# Patient Record
Sex: Male | Born: 1960 | Race: White | Hispanic: No | Marital: Married
Health system: Southern US, Community
[De-identification: ages and names within clinical notes are randomized; demographics above are authoritative.]

---

## 1999-07-22 ENCOUNTER — Encounter: Payer: Self-pay | Admitting: Gastroenterology

## 1999-07-22 ENCOUNTER — Encounter: Admission: RE | Admit: 1999-07-22 | Discharge: 1999-07-22 | Payer: Self-pay | Admitting: Gastroenterology

## 2000-06-15 ENCOUNTER — Encounter: Payer: Self-pay | Admitting: Orthopedic Surgery

## 2000-06-15 ENCOUNTER — Encounter: Admission: RE | Admit: 2000-06-15 | Discharge: 2000-06-15 | Payer: Self-pay | Admitting: Orthopedic Surgery

## 2000-08-09 ENCOUNTER — Encounter: Payer: Self-pay | Admitting: Orthopedic Surgery

## 2000-08-09 ENCOUNTER — Encounter: Admission: RE | Admit: 2000-08-09 | Discharge: 2000-08-09 | Payer: Self-pay | Admitting: Orthopedic Surgery

## 2008-01-30 ENCOUNTER — Ambulatory Visit (HOSPITAL_COMMUNITY): Admission: RE | Admit: 2008-01-30 | Discharge: 2008-01-31 | Payer: Self-pay | Admitting: Orthopedic Surgery

## 2010-10-18 NOTE — Op Note (Signed)
NAMEJOEANTHONY, SEELING                ACCOUNT NO.:  [id number]   MEDICAL RECORD NO.:  [id number]          PATIENT TYPE:  AMB   LOCATION:  SDS                          FACILITY:  [facility]   PHYSICIAN:  Alvy Beal, MD    DATE OF BIRTH:  1960/06/26   DATE OF PROCEDURE:  01/30/2008  DATE OF DISCHARGE:                               OPERATIVE REPORT   PREOPERATIVE DIAGNOSIS:  Degenerative cervical disk disease, C4-C5 and  C5-C6 with right arm radicular pain.   POSTOPERATIVE DIAGNOSIS:  Degenerative cervical disk disease, C4-C5 and  C5-C6 with right arm radicular pain.   OPERATIVE PROCEDURE:  Anterior cervical diskectomy and fusion, C4-C5 and  C5-C6.   COMPLICATIONS:  None.   ESTIMATED BLOOD LOSS:  45 mL.   INSTRUMENTATION USED:  Synthes Vector anterior cervical plate 34 mm in  length with 14 mL variable screws affixing to the bodies of C4-C5, 16-mm  screws into the body of C6 with 7-mm lordotic spacer.   FIRST ASSISTANT:  Crissie Reese, PA   OPERATIVE NOTE:  This is a very pleasant 50 year old gentleman who has  had significant neck and right arm pain.  Preoperative evaluation  confirmed the diagnosis of degenerative disk disease with right C5-C6  nerve irritation.  After discussing treatment options and the failure of  conservative management, the patient elected to proceed with surgery.  All appropriate risks, benefits, and alternatives were discussed and  consent was obtained.   OPERATIVE NOTE:  The patient was brought to the operating room and  placed supine on the operating table.  After successful induction of  general anesthesia and endotracheal intubation, TEDs, SCDs were applied  and the arms were secured down with tape and a rolled towel was placed  underneath the neck.  The neck was then prepped and draped in the  standard fashion.   A left standard Clementeen Graham approach was taken through a vertical  incision made just centered about the thyroid cartilage.   Sharp  dissection was carried out down to and through the platysma.  I then  sharply dissected through the deep cervical fascia mobilizing the  omohyoid.  Once I released the omohyoid from its length, it was mobile  enough that I did not need to sacrifice it.  I then swept the trachea  and esophagus medially and protected it with a appendiceal retractor and  then palpated and protected the carotid sheath laterally with a finger.  At this point, I could visualize the anterior cervical spine.  The  prevertebral fascia was mobilized using Kitner dissectors and I placed a  needle into the C4-C5 interbody space.  Once I confirmed that I was at  the appropriate level with lateral fluoroscopy, I then mobilized the  longus coli muscles out to the level of the uncovertebral joints using  bipolar electrocautery.  I then placed the self-retaining retractors,  the Caspar retractor, pins, and blades in, deflated the endotracheal  cuff, expanded them, and then reinflated the endotracheal cuff to reset  the pressure.  Distraction pins were placed into the bodies of C4, C5,  C6,  and then I distracted the C4-C5 disk space.   I incised the annulus with a 15 blade scalpel and using a combination of  pituitary rongeurs, curettes, and Kerrison rongeurs, I resected the  entire disk material.  I then developed a plane underneath the posterior  longitudinal ligament and resected the posterior longitudinal with a 1-  mm micro Kerrison rongeur.  At this point, I noted that there was a  fragment of disk material on the right-hand side and a significant  osteophyte which I was able to resect with the Kerrison rongeur.  With  the diskectomy completed, I rasped the endplates, irrigated copiously  with normal saline, and assured hemostasis.  I then measured the  interbody space, obtained a 7-mm precut lordotic M-TLIF fibular spacer  packed with Actifuse and malleted it to the appropriate depth.  Once I  was at the  appropriate depth, I removed the distraction device and reset  the Caspar retractors to expose now the C5-C6 interbody space.  Using  the same technique I used at C4-C5, I resected the disk material and  again took down the posterior longitudinal ligament.  Once again, there  was thickened hard disk osteophyte at the right-hand side which I was  able to resect.  At this point with the diskectomy completed, I measured  the interbody space, took another 7-mm lordotic precut graft, packed  with Actifuse and malleted to the appropriate depth.  I then confirmed  the satisfactory position of both grafts on lateral fluoroscopy, removed  the Caspar retracting blades, pins, and then took down many of superior  osteophytes that were still present.  I then took a contoured 34-mm  anterior cervical plate, gently contoured it, and secured it down with a  temporary set pin.  I confirmed satisfactory position in the lateral  plane and then secured it to the body of C4 with 14-mm variable angled  screws to the body of C6 with 60-mm variable screws and 40-mm variable  screws at C5.  All screws were properly locked and the D ring capture  mechanism was properly engaged.  I then rechecked to ensure that the  esophagus was not entrapped underneath the plate.  I then irrigated  copiously with normal saline, obtained hemostasis using bipolar  electrocautery, and then returned the trachea and esophagus to midline.  I then closed the platysma with interrupted 2-0 Vicryl sutures and 3-0  Monocryl for the skin.  Steri-Strips and Aspen collar were applied.  The  patient was extubated and transferred to PACU without incident.  At the  end of the case, all needle and sponge counts were correct.      Alvy Beal, MD  Electronically Signed     DDB/MEDQ  D:  01/30/2008  T:  01/31/2008  Job:  [id number]

## 2014-01-08 ENCOUNTER — Other Ambulatory Visit: Payer: Self-pay | Admitting: Family Medicine

## 2014-01-08 ENCOUNTER — Ambulatory Visit
Admission: RE | Admit: 2014-01-08 | Discharge: 2014-01-08 | Disposition: A | Discharge status: Home / Self Care | Payer: 59 | Source: Ambulatory Visit | Attending: Family Medicine | Admitting: Family Medicine

## 2014-01-08 DIAGNOSIS — M5410 Radiculopathy, site unspecified: Secondary | ICD-10-CM

## 2015-03-24 ENCOUNTER — Other Ambulatory Visit: Payer: Self-pay | Admitting: Surgery

## 2015-03-24 DIAGNOSIS — E042 Nontoxic multinodular goiter: Secondary | ICD-10-CM

## 2015-03-26 ENCOUNTER — Ambulatory Visit
Admission: RE | Admit: 2015-03-26 | Discharge: 2015-03-26 | Disposition: A | Discharge status: Home / Self Care | Payer: 59 | Source: Ambulatory Visit | Attending: Surgery | Admitting: Surgery

## 2015-03-26 DIAGNOSIS — E042 Nontoxic multinodular goiter: Secondary | ICD-10-CM

## 2015-04-08 ENCOUNTER — Other Ambulatory Visit: Payer: Self-pay | Admitting: Surgery

## 2015-04-08 DIAGNOSIS — E041 Nontoxic single thyroid nodule: Secondary | ICD-10-CM

## 2015-04-13 ENCOUNTER — Other Ambulatory Visit (HOSPITAL_COMMUNITY)
Admission: RE | Admit: 2015-04-13 | Discharge: 2015-04-13 | Disposition: A | Discharge status: Home / Self Care | Payer: 59 | Source: Ambulatory Visit | Attending: Radiology | Admitting: Radiology

## 2015-04-13 ENCOUNTER — Ambulatory Visit
Admission: RE | Admit: 2015-04-13 | Discharge: 2015-04-13 | Disposition: A | Discharge status: Home / Self Care | Payer: 59 | Source: Ambulatory Visit | Attending: Surgery | Admitting: Surgery

## 2015-04-13 DIAGNOSIS — E041 Nontoxic single thyroid nodule: Secondary | ICD-10-CM

## 2015-08-27 ENCOUNTER — Other Ambulatory Visit: Payer: Self-pay | Admitting: Surgery

## 2015-08-27 DIAGNOSIS — R9389 Abnormal findings on diagnostic imaging of other specified body structures: Secondary | ICD-10-CM

## 2015-09-01 ENCOUNTER — Ambulatory Visit
Admission: RE | Admit: 2015-09-01 | Discharge: 2015-09-01 | Disposition: A | Discharge status: Home / Self Care | Payer: 59 | Source: Ambulatory Visit | Attending: Surgery | Admitting: Surgery

## 2015-09-01 DIAGNOSIS — R9389 Abnormal findings on diagnostic imaging of other specified body structures: Secondary | ICD-10-CM

## 2016-09-13 ENCOUNTER — Other Ambulatory Visit: Payer: Self-pay | Admitting: Surgery

## 2016-09-13 DIAGNOSIS — E041 Nontoxic single thyroid nodule: Secondary | ICD-10-CM

## 2016-09-20 ENCOUNTER — Ambulatory Visit
Admission: RE | Admit: 2016-09-20 | Discharge: 2016-09-20 | Disposition: A | Discharge status: Home / Self Care | Payer: 59 | Source: Ambulatory Visit | Attending: Surgery | Admitting: Surgery

## 2016-09-20 DIAGNOSIS — E041 Nontoxic single thyroid nodule: Secondary | ICD-10-CM

## 2017-08-10 IMAGING — US US THYROID BIOPSY
1 series · 13 of 17 positions shown · non-contrast
Comparison: none

CLINICAL DATA: Patient with history of bilateral thyroid nodules
with a dominant solid/cystic left lower pole nodule measuring up to
2.5 cm. Request now made for needle aspirate biopsy of this dominant
left thyroid nodule.

[Series 1: us thyroid biopsy · 0.09mm/px · 17 acquisitions, 13 frames shown]
[im 1/17]
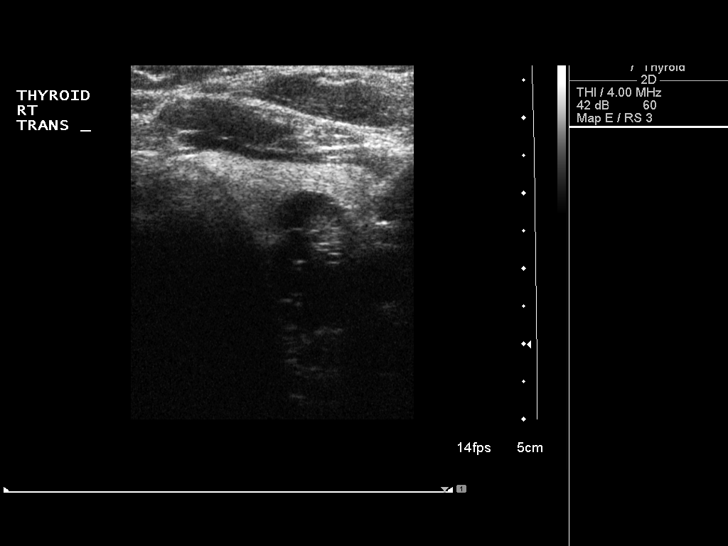
[im 2/17]
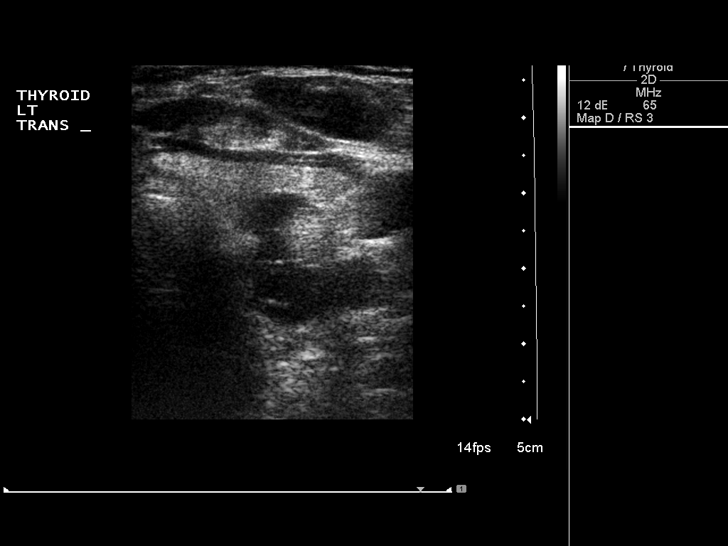
[im 4/17]
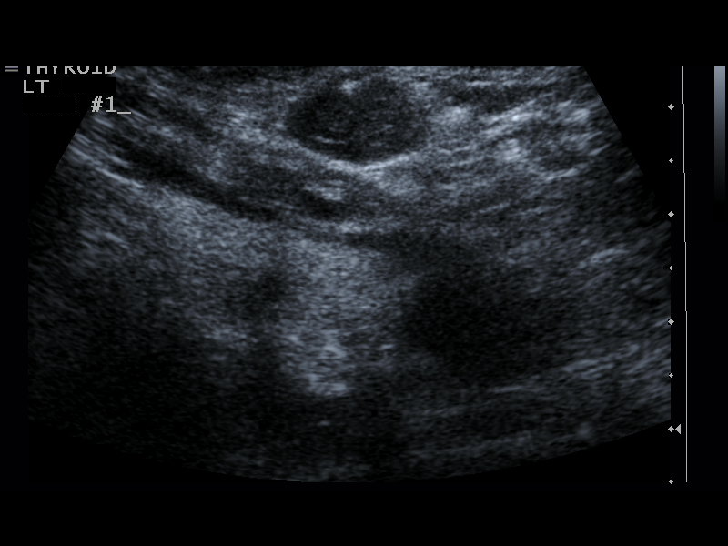
[im 5/17]
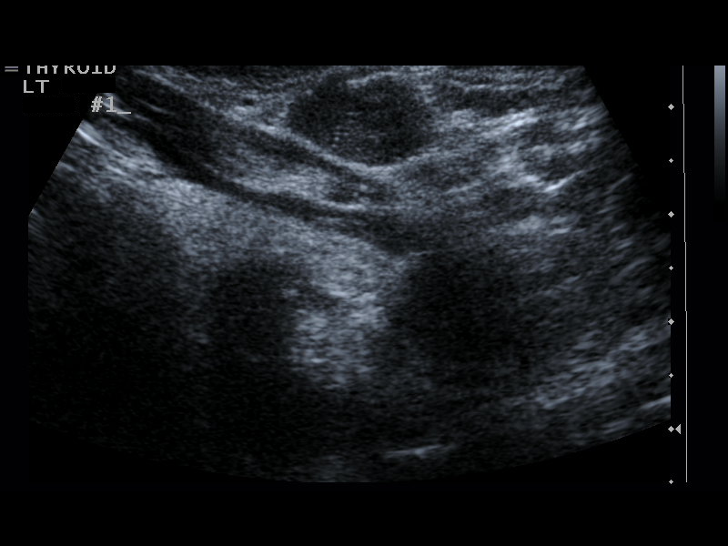
[im 6/17]
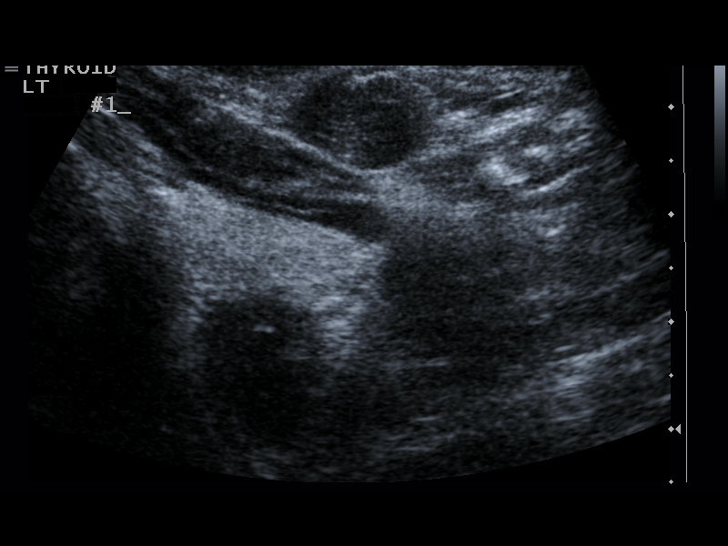
[im 8/17]
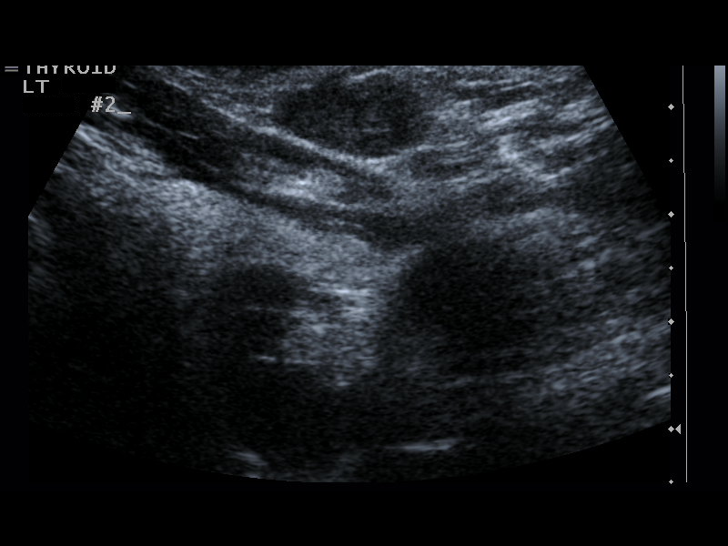
[im 9/17]
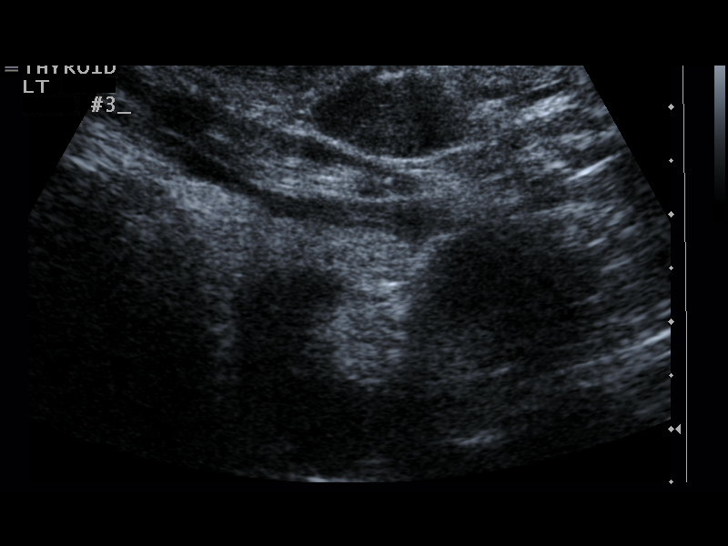
[im 10/17]
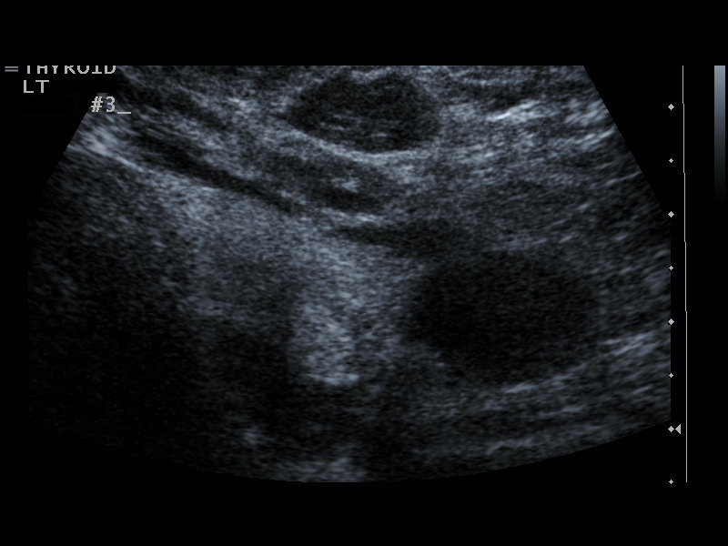
[im 12/17]
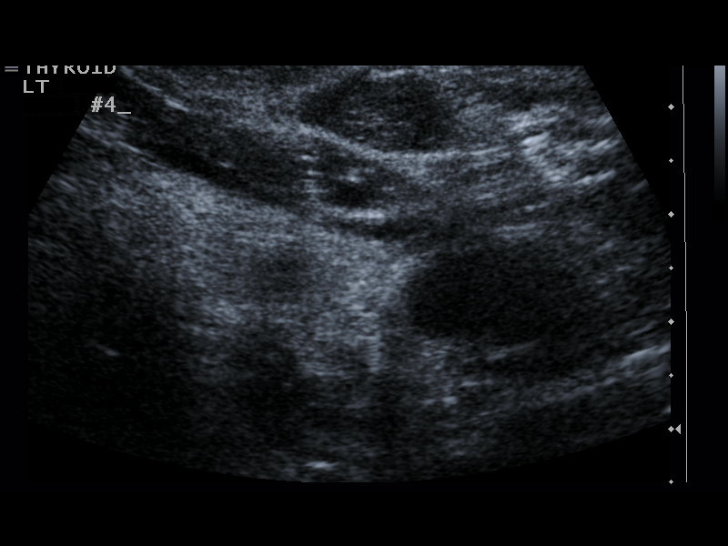
[im 13/17]
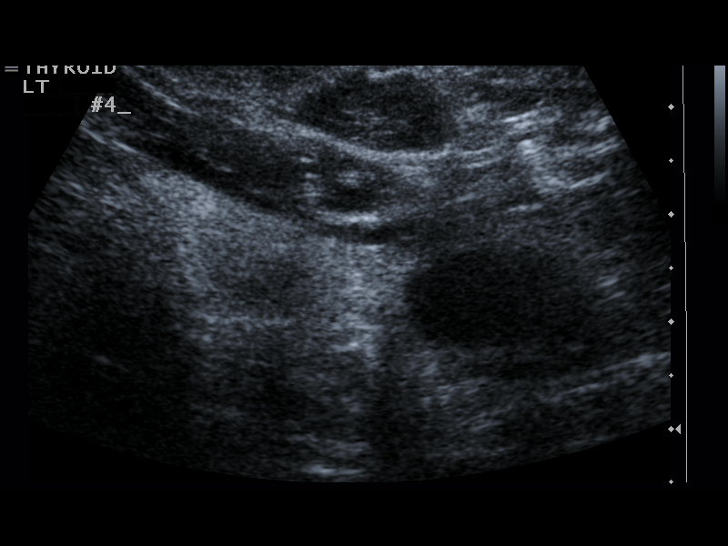
[im 14/17]
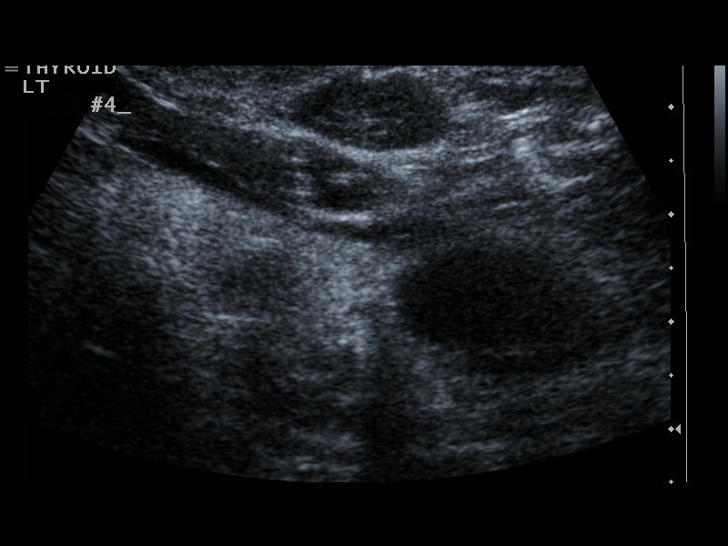
[im 16/17]
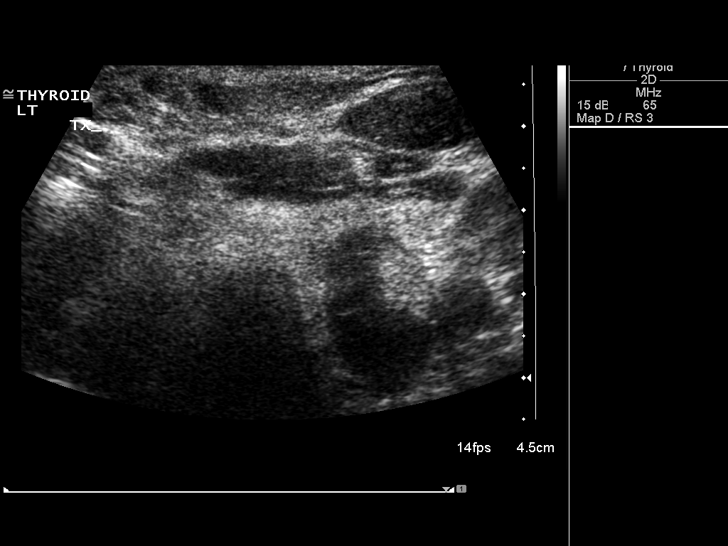
[im 17/17]
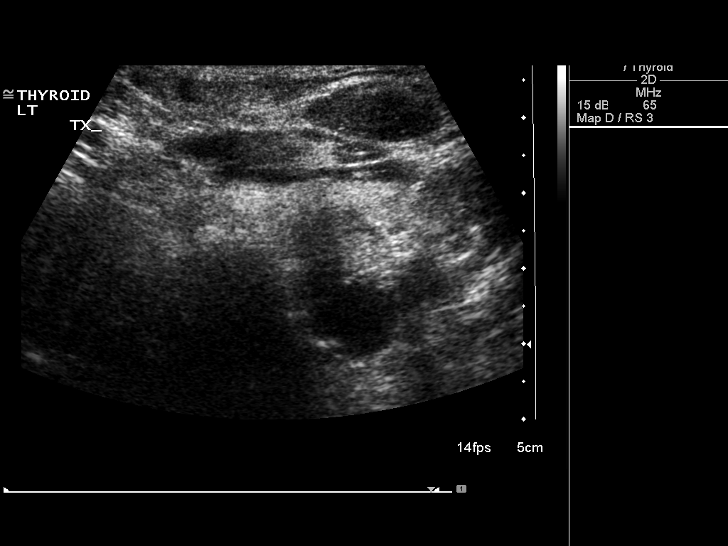

[13 of 17 positions shown; findings below may reference images not displayed]

EXAM:
ULTRASOUND GUIDED NEEDLE ASPIRATE BIOPSY OF DOMINANT LEFT LOWER POLE
THYROID NODULE

MEDICATIONS:
None

PROCEDURE:
Thyroid biopsy was thoroughly discussed with the patient and
questions were answered. The benefits, risks, alternatives and
complications were also discussed. The patient understands and
wishes to proceed with the procedure. Written consent was obtained.

Ultrasound was performed to localize and mark an adequate site for
the biopsy. The patient was then prepped and draped in a normal
sterile fashion. Local anesthesia was provided with 1% lidocaine.
Under direct ultrasound guidance, 4 passes were made using 25 gauge
needles into the dominant nodule within the left lower pole thyroid.
Ultrasound was used to confirm needle placements on all occasions.
Specimens were sent to pathology for analysis.

COMPLICATIONS:
None.
FINDINGS: Dominant left lower pole thyroid nodule measuring up to 2.5 cm.
IMPRESSION: Ultrasound-guided needle aspirate biopsy performed of a dominant
left lower pole thyroid nodule. Pathology pending.

## 2017-10-26 ENCOUNTER — Other Ambulatory Visit: Payer: Self-pay | Admitting: Surgery

## 2017-10-26 DIAGNOSIS — E042 Nontoxic multinodular goiter: Secondary | ICD-10-CM

## 2017-11-05 ENCOUNTER — Ambulatory Visit
Admission: RE | Admit: 2017-11-05 | Discharge: 2017-11-05 | Disposition: A | Discharge status: Home / Self Care | Payer: 59 | Source: Ambulatory Visit | Attending: Surgery | Admitting: Surgery

## 2017-11-05 DIAGNOSIS — E042 Nontoxic multinodular goiter: Secondary | ICD-10-CM

## 2017-12-29 IMAGING — US US THYROID
1 series · 13 of 25 positions shown · non-contrast
Comparison: Thyroid ultrasound - 03/26/2015; left-sided thyroid
nodule ultrasound-guided fine-needle aspiration - 04/13/2015

CLINICAL DATA: Follow-up nodules seen on MRI

EXAM:
THYROID ULTRASOUND
TECHNIQUE: Ultrasound examination of the thyroid gland and adjacent soft
tissues was performed.

[Series 1: us thyroid · 0.08mm/px · 13 of 37 slices shown]
[im 1/37]
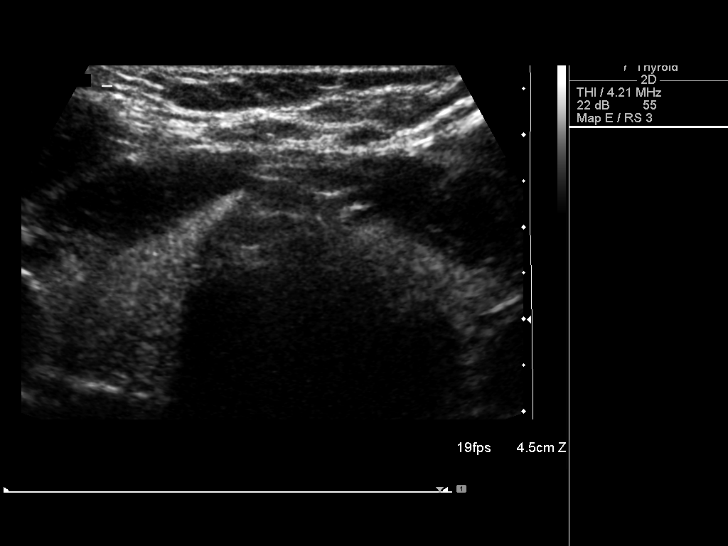
[im 4/37]
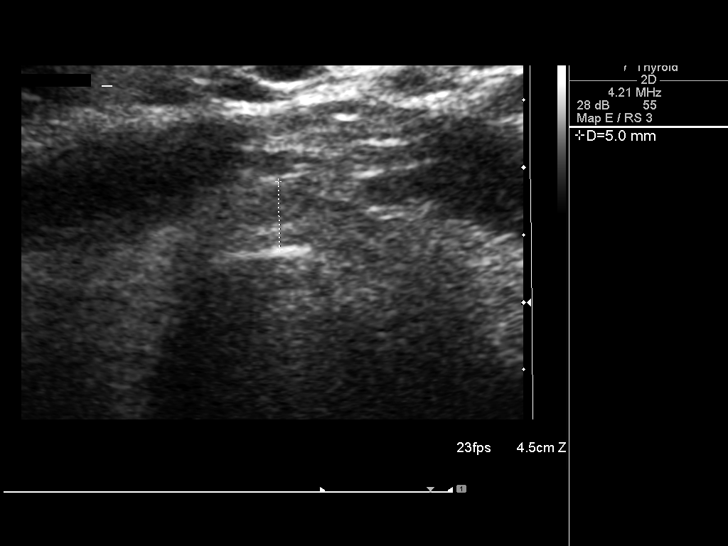
[im 7/37]
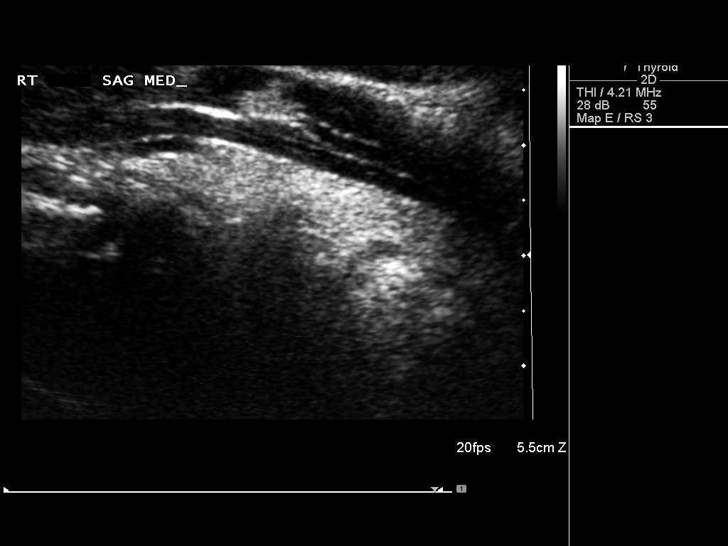
[im 10/37]
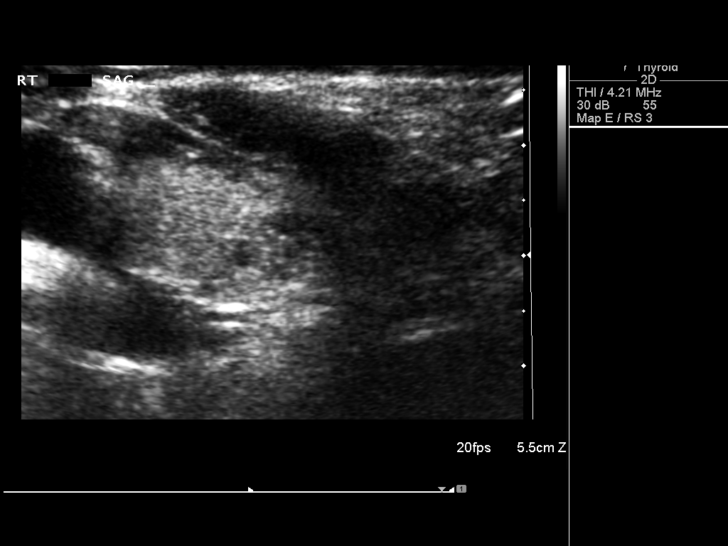
[im 13/37]
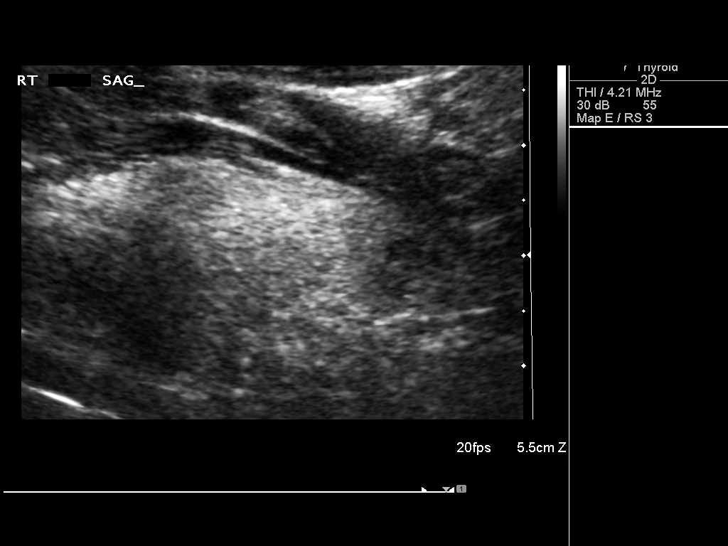
[im 16/37]
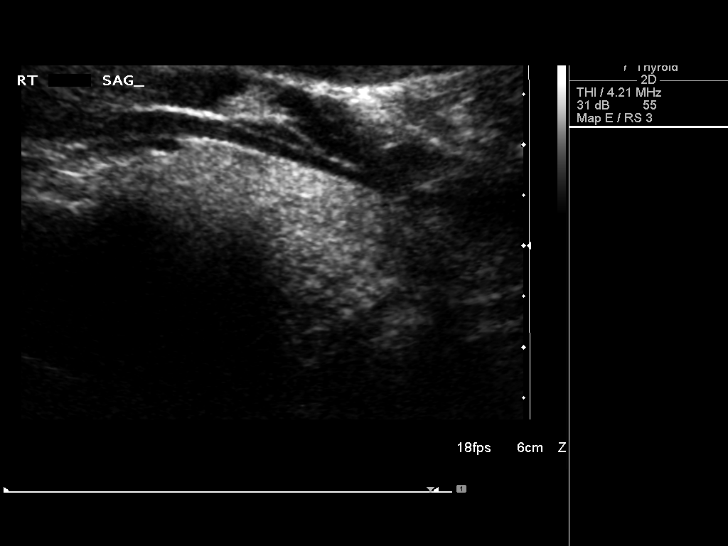
[im 19/37]
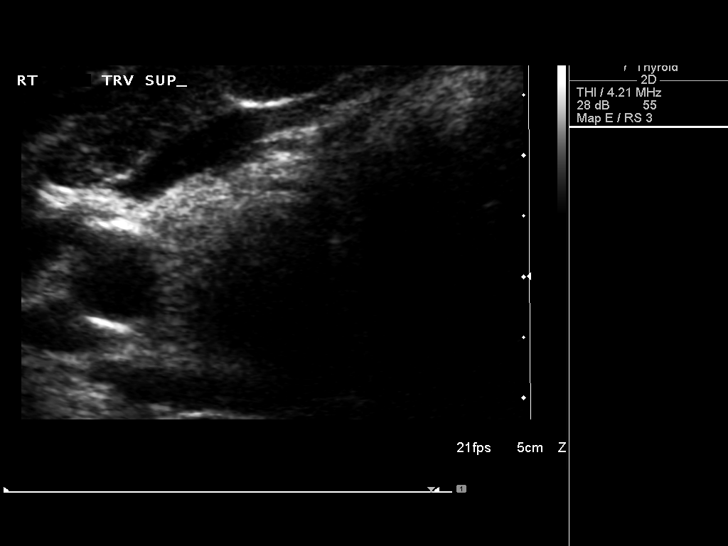
[im 22/37]
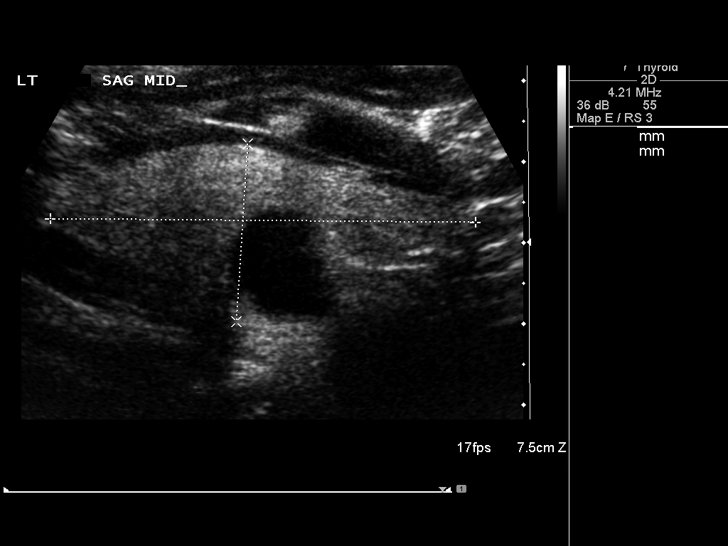
[im 25/37]
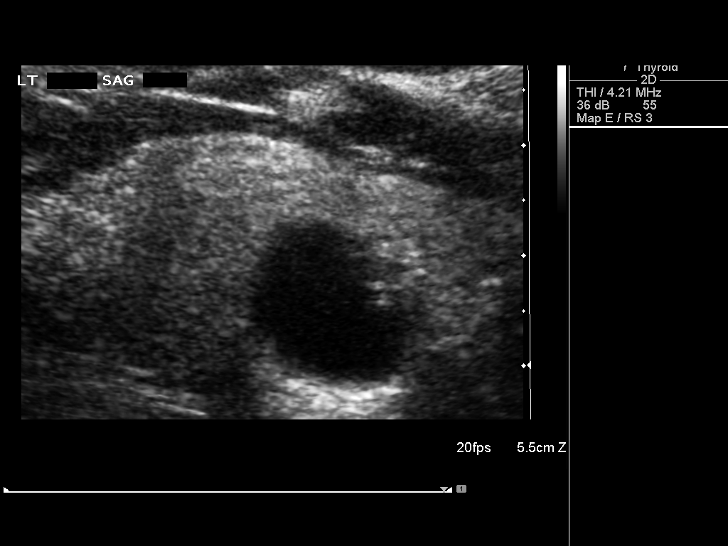
[im 28/37]
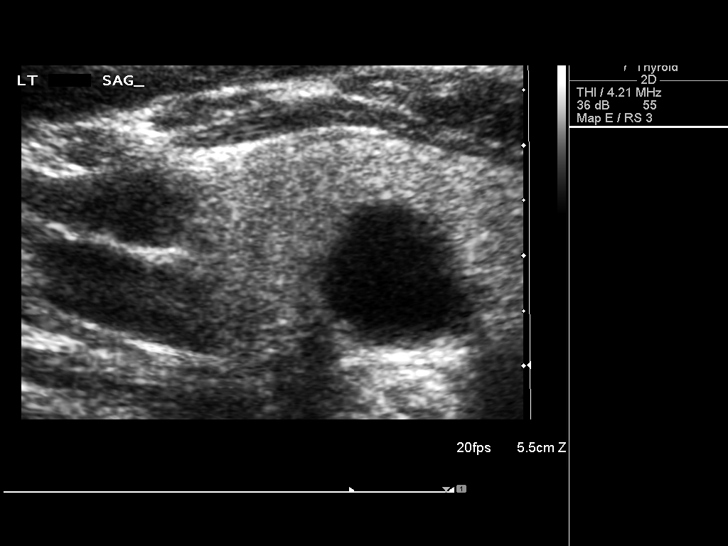
[im 31/37]
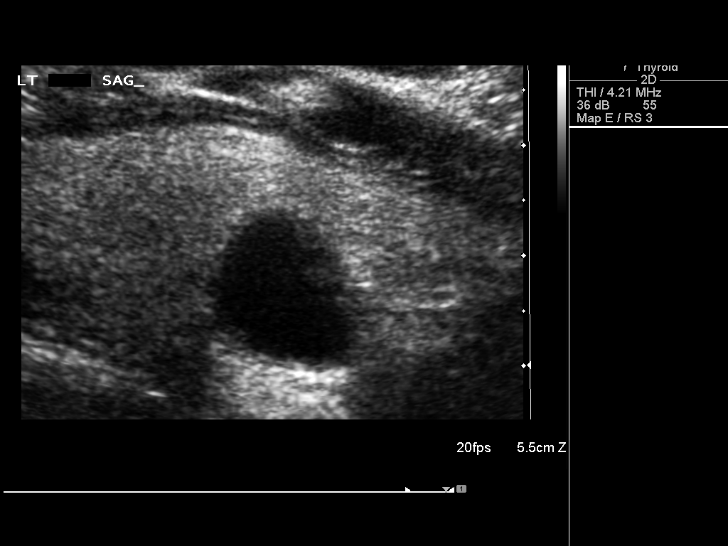
[im 34/37]
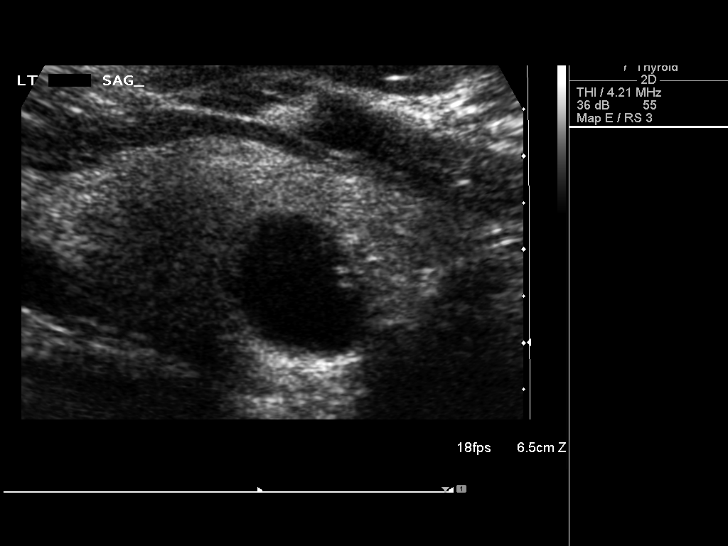
[im 37/37]
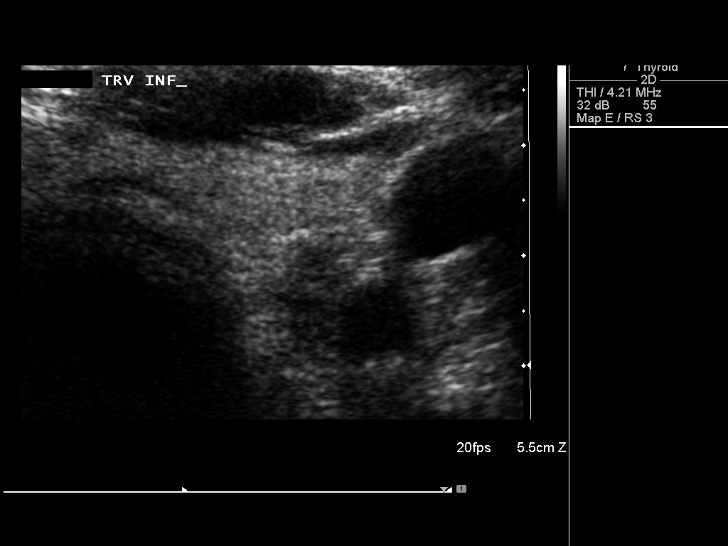

[13 of 25 positions shown; findings below may reference images not displayed]

FINDINGS: There is relative homogeneity the thyroid parenchymal echotexture.
No definitive new or enlarging thyroid nodules.

Right thyroid lobe

Measurements: Normal in size measuring 5.1 x 1.6 x 1.6 cm,
unchanged.

Right, inferior - 1.0 x 0.7 x 1.0 cm - mixed echogenic, solid -
grossly unchanged, previously, 0.8 x 0.7 cm with slight differences
likely attributable to scan plane projection.

Left thyroid lobe

Measurements: Normal in size measuring 5.3 x 2.2 x 1.5 cm.

Left, inferior, posterior - 1.7 x 1.6 x 1.2 cm - anechoic, cystic
with minimal eccentric echogenic nodule - decreased in size in
interval, previously, 1.7 x 1.7 x 2.5 cm. This nodule was previously
biopsied.

Isthmus

Thickness: Normal in size measuring 0.3 cm in diameter.

No discrete nodule or mass identified within the thyroid isthmus.

Lymphadenopathy

None visualized.
IMPRESSION: Similar findings of multi nodular goiter. The dominant now
approximately 1.7 cm previously biopsied nodule within the inferior,
posterior aspect of the left lobe of the thyroid has decreased in
size since the [DATE] examination. Correlation with prior biopsy
results is recommended. No definitive new or enlarging thyroid
nodules.

## 2019-01-03 IMAGING — US US THYROID
1 series · 13 of 25 positions shown · non-contrast
Comparison: Prior thyroid ultrasound 09/20/2016

CLINICAL DATA: Goiter. 57-year-old male with a history of thyroid
nodules

EXAM:
THYROID ULTRASOUND
TECHNIQUE: Ultrasound examination of the thyroid gland and adjacent soft
tissues was performed.

[Series 1: us thyroid · 0.06mm/px · 13 of 41 slices shown]
[im 1/41]
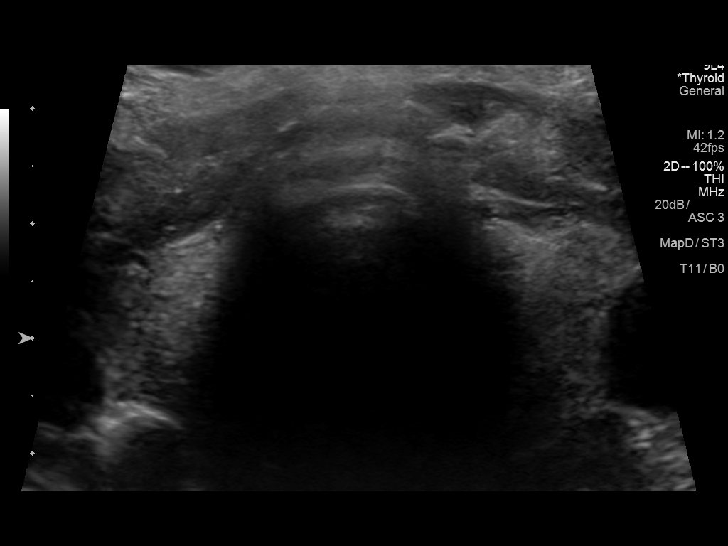
[im 4/41]
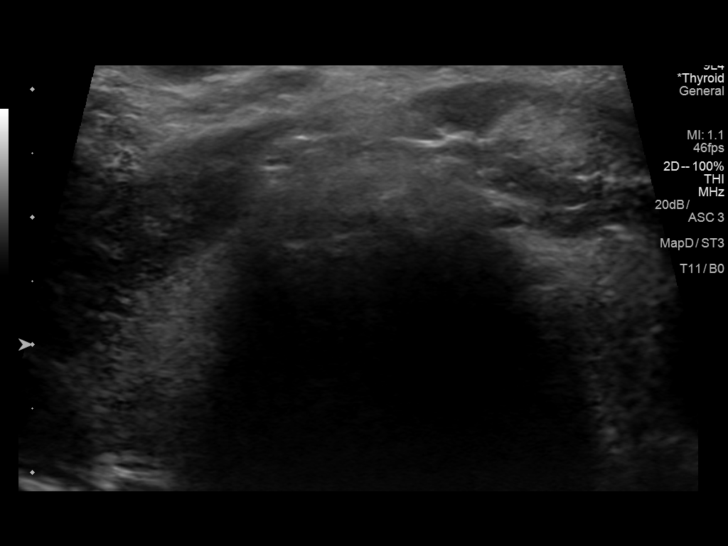
[im 7/41]
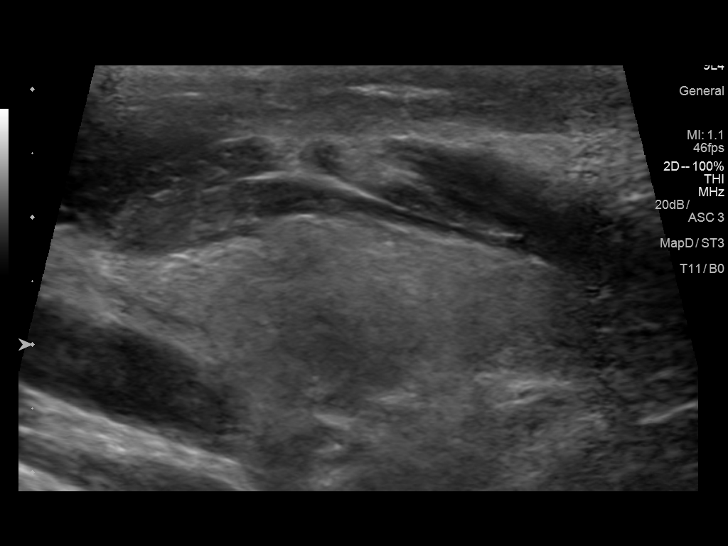
[im 11/41]
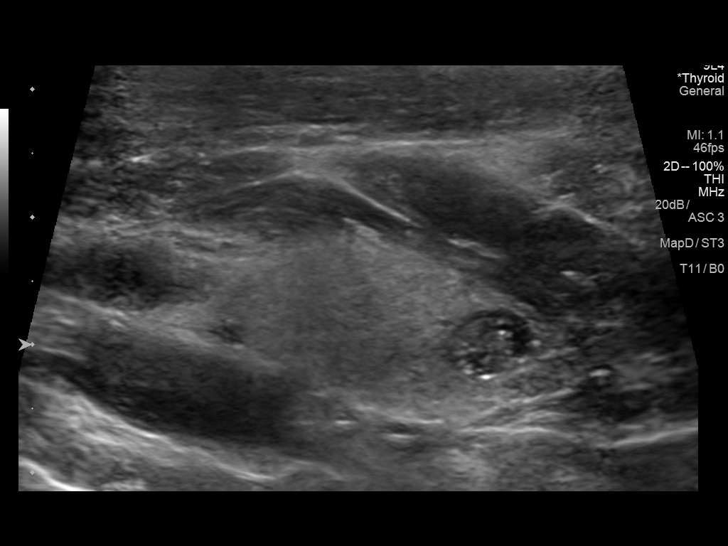
[im 14/41]
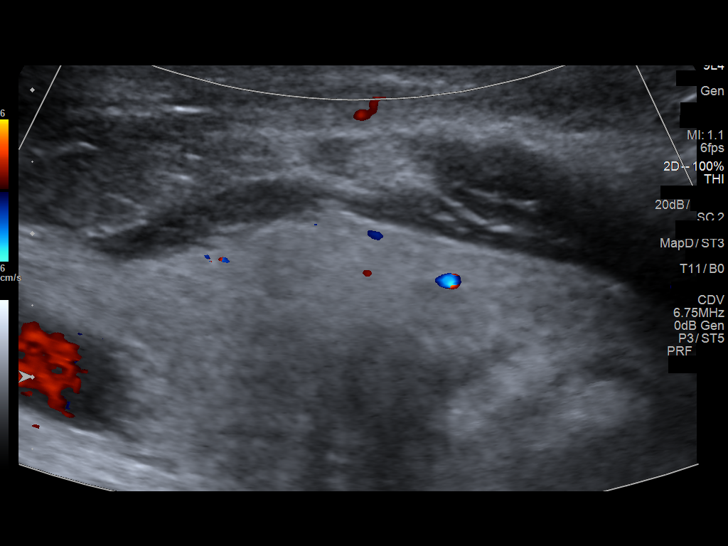
[im 17/41]
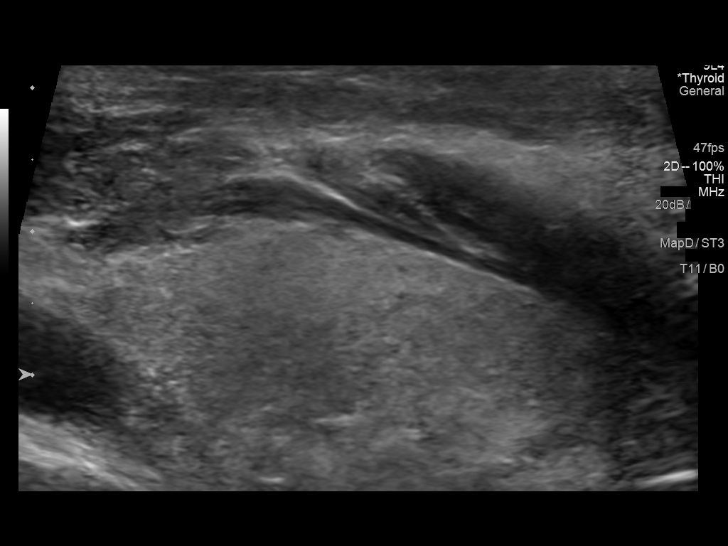
[im 21/41]
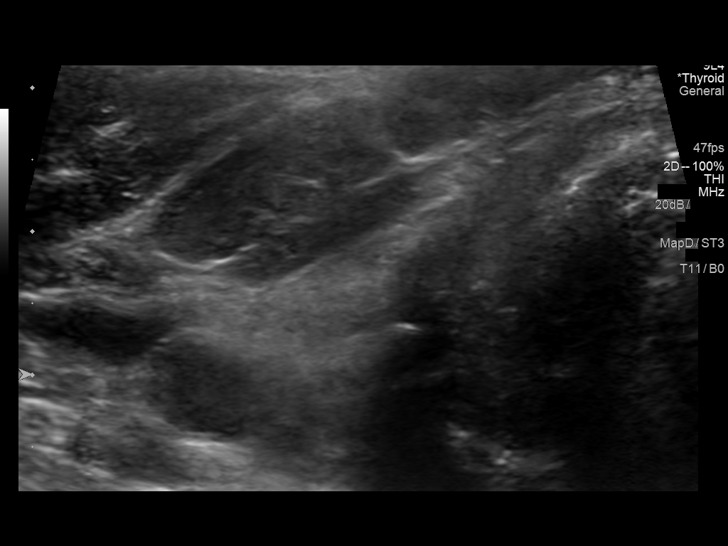
[im 24/41]
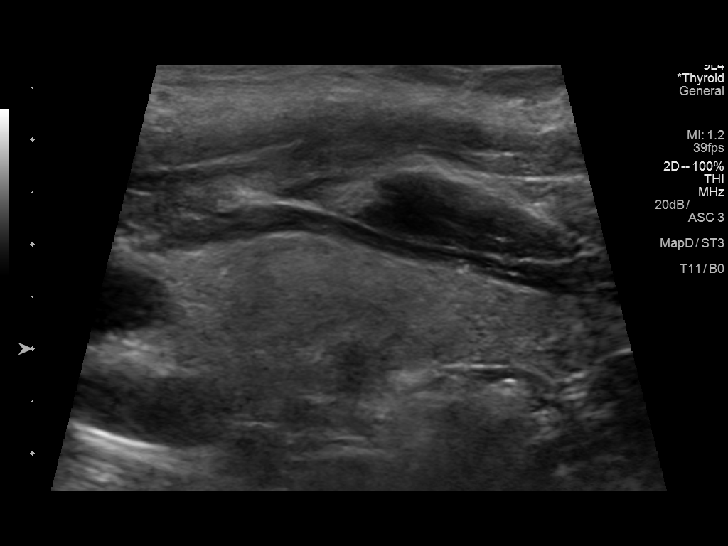
[im 27/41]
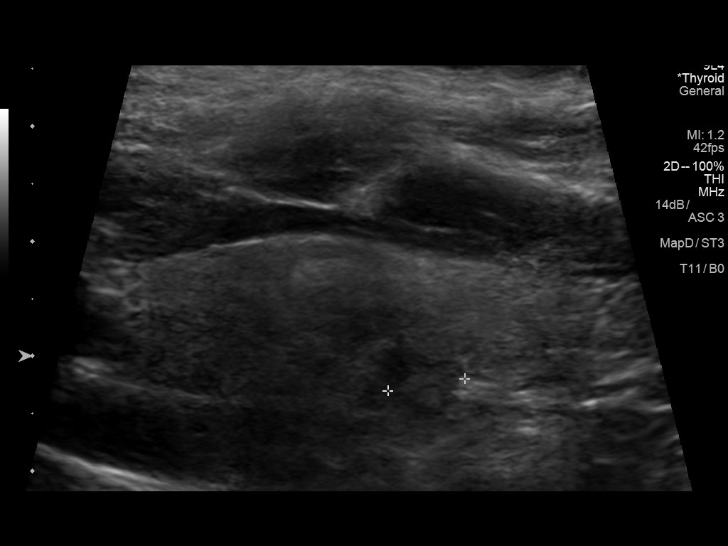
[im 31/41]
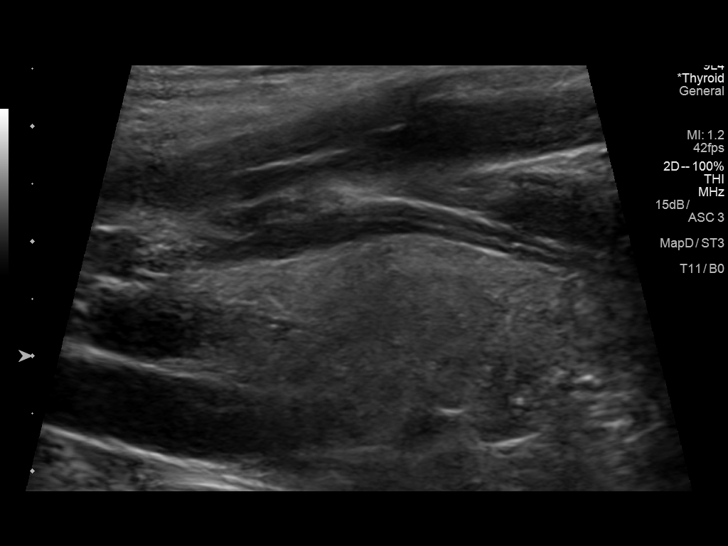
[im 34/41]
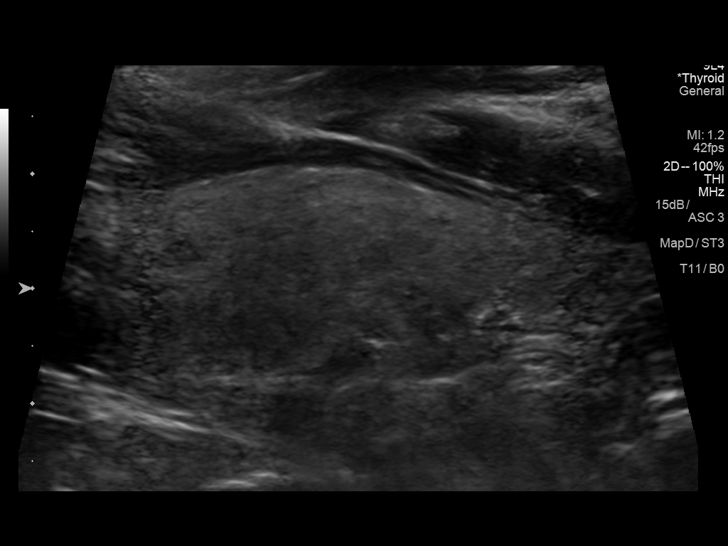
[im 37/41]
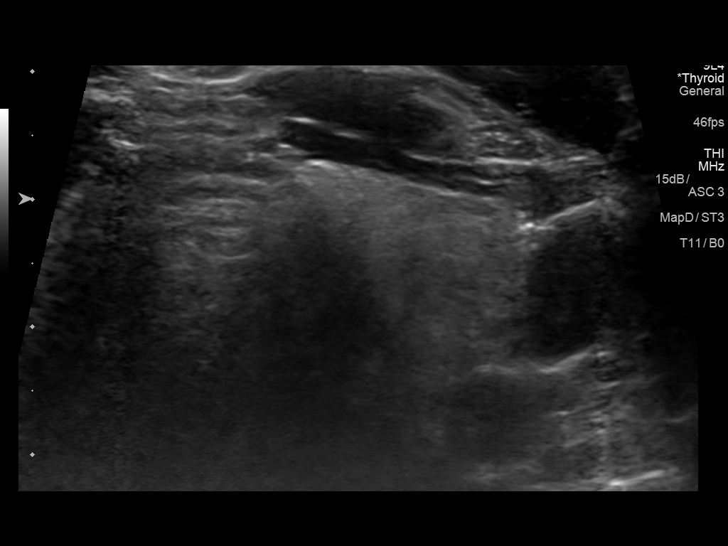
[im 41/41]
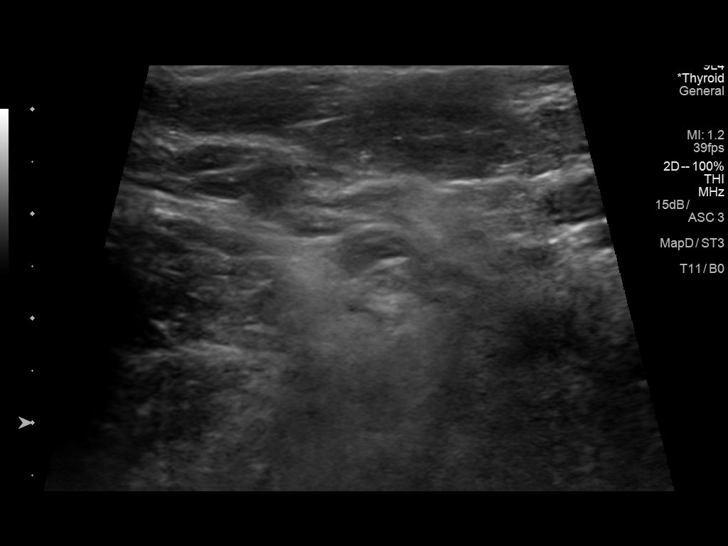

[13 of 25 positions shown; findings below may reference images not displayed]

FINDINGS: Parenchymal Echotexture: Mildly heterogenous

Isthmus: 0.3 cm

Right lobe: 4.5 x 1.6 x

Left lobe: 5.0 x 1.9 x 1.5 cm

_________________________________________________________

Estimated total number of nodules >/= 1 cm: 0

Number of spongiform nodules >/=  2 cm not described below (TR1): 0

Number of mixed cystic and solid nodules >/= 1.5 cm not described
below (TR2): 0

_________________________________________________________

Nodule # 1:

Prior biopsy: No

Location: Right; Inferior

Maximum size: 0.7 cm; Other 2 dimensions: 0.6 x 0.6 cm, previously,
0.9 x 0.7 x 0.8 cm

Composition: solid/almost completely solid (2)

Echogenicity: hypoechoic (2)

Shape: not taller-than-wide (0)

Margins: smooth (0)

Echogenic foci: none (0) the previously noted punctate echogenic
foci are more clearly delineated on today's examination and appear
to represent linear reflectors related to small internal cystic
components rather than true micro foci.

ACR TI-RADS total points: 4.

ACR TI-RADS risk category:  TR4 (4-6 points).

Significant change in size (>/= 20% in two dimensions and minimal
increase of 2 mm): Yes

Change in features: Yes

Change in ACR TI-RADS risk category: Yes

ACR TI-RADS recommendations:

Involuting thyroid nodule with decreasing TI-RADS categorization
consistent with a benign process. No further follow-up required.

_________________________________________________________

Further involution of the left inferior thyroid nodule as well. This
nodule is also consistent with a benign process and no longer
requires follow-up.
IMPRESSION: 1. Interval involution of both the right and left inferior thyroid
nodules consistent with benignity. These nodules no longer meets
criteria for continued dedicated imaging follow-up.
2. No new or suspicious thyroid nodules.

The above is in keeping with the ACR TI-RADS recommendations - [HOSPITAL] 1587;[DATE].

## 2019-04-18 ENCOUNTER — Other Ambulatory Visit: Payer: Self-pay

## 2019-04-18 DIAGNOSIS — Z20822 Contact with and (suspected) exposure to covid-19: Secondary | ICD-10-CM

## 2019-04-19 LAB — NOVEL CORONAVIRUS, NAA: SARS-CoV-2, NAA: NOT DETECTED

## 2019-04-22 ENCOUNTER — Telehealth: Payer: Self-pay | Admitting: Family Medicine

## 2019-04-22 NOTE — Telephone Encounter (Signed)
Negative COVID results given. Patient results "NOT Detected." Caller expressed understanding. ° °

## 2019-08-08 ENCOUNTER — Ambulatory Visit: Payer: 59 | Attending: Internal Medicine

## 2019-08-08 DIAGNOSIS — Z23 Encounter for immunization: Secondary | ICD-10-CM | POA: Insufficient documentation

## 2019-08-08 NOTE — Progress Notes (Signed)
   Covid-19 Vaccination Clinic  Name:  Gregory Adkins    MRN: [id number] DOB: [birth year]  08/08/2019  [name] was observed post Covid-19 immunization for 15 minutes without incident. He was provided with Vaccine Information Sheet and instruction to access the V-Safe system.   [name] was instructed to call 911 with any severe reactions post vaccine: Marland Kitchen Difficulty breathing  . Swelling of face and throat  . A fast heartbeat  . A bad rash all over body  . Dizziness and weakness   Immunizations Administered    Name Date Dose VIS Date Route   Pfizer COVID-19 Vaccine 08/08/2019 10:34 AM 0.3 mL 05/16/2019 Intramuscular   Manufacturer: [employer], [employer]   Lot: [id number]   NDC: [address]-6

## 2019-09-03 ENCOUNTER — Ambulatory Visit: Payer: 59 | Attending: Internal Medicine

## 2019-09-03 DIAGNOSIS — Z23 Encounter for immunization: Secondary | ICD-10-CM

## 2019-09-03 NOTE — Progress Notes (Signed)
   Covid-19 Vaccination Clinic  Name:  Gregory Adkins    MRN: [id number] DOB: 1960/07/06  09/03/2019  [name] was observed post Covid-19 immunization for 15 minutes without incident. He was provided with Vaccine Information Sheet and instruction to access the V-Safe system.   [name] was instructed to call 911 with any severe reactions post vaccine: Marland Kitchen Difficulty breathing  . Swelling of face and throat  . A fast heartbeat  . A bad rash all over body  . Dizziness and weakness   Immunizations Administered    Name Date Dose VIS Date Route   Pfizer COVID-19 Vaccine 09/03/2019  9:37 AM 0.3 mL 05/16/2019 Intramuscular   Manufacturer: [employer], [employer]   Lot: [id number]   NDC: [address]-3

## 2020-06-11 ENCOUNTER — Ambulatory Visit: Payer: 59 | Attending: Internal Medicine

## 2020-06-11 DIAGNOSIS — Z23 Encounter for immunization: Secondary | ICD-10-CM

## 2020-06-11 NOTE — Progress Notes (Signed)
   Covid-19 Vaccination Clinic  Name:  Gregory Adkins    MRN: [id number] DOB: 06-Sep-1960  06/11/2020  Mr. Gregory Adkins was observed post Covid-19 immunization for 15 minutes without incident. He was provided with Vaccine Information Sheet and instruction to access the V-Safe system.   Mr. Gregory Adkins was instructed to call 911 with any severe reactions post vaccine: Marland Kitchen Difficulty breathing  . Swelling of face and throat  . A fast heartbeat  . A bad rash all over body  . Dizziness and weakness   Immunizations Administered    Name Date Dose VIS Date Route   Pfizer COVID-19 Vaccine 06/11/2020  4:15 PM 0.3 mL 03/24/2020 Intramuscular   Manufacturer: [employer], [employer]   Lot: [id number]   NDC: [address]-1

## 2020-10-06 ENCOUNTER — Other Ambulatory Visit: Payer: Self-pay | Admitting: Family Medicine

## 2020-10-06 DIAGNOSIS — R109 Unspecified abdominal pain: Secondary | ICD-10-CM

## 2020-10-14 ENCOUNTER — Ambulatory Visit
Admission: RE | Admit: 2020-10-14 | Discharge: 2020-10-14 | Disposition: A | Discharge status: Home / Self Care | Payer: 59 | Source: Ambulatory Visit | Attending: Family Medicine | Admitting: Family Medicine

## 2020-10-14 DIAGNOSIS — R109 Unspecified abdominal pain: Secondary | ICD-10-CM

## 2022-02-28 ENCOUNTER — Other Ambulatory Visit: Payer: Self-pay | Admitting: Surgery

## 2022-02-28 DIAGNOSIS — K824 Cholesterolosis of gallbladder: Secondary | ICD-10-CM

## 2022-03-06 ENCOUNTER — Ambulatory Visit
Admission: RE | Admit: 2022-03-06 | Discharge: 2022-03-06 | Disposition: A | Discharge status: Home / Self Care | Payer: 59 | Source: Ambulatory Visit | Attending: Surgery | Admitting: Surgery

## 2022-03-06 DIAGNOSIS — K824 Cholesterolosis of gallbladder: Secondary | ICD-10-CM

## 2023-02-08 DIAGNOSIS — S39012A Strain of muscle, fascia and tendon of lower back, initial encounter: Secondary | ICD-10-CM | POA: Diagnosis not present

## 2023-02-08 DIAGNOSIS — I10 Essential (primary) hypertension: Secondary | ICD-10-CM | POA: Diagnosis not present

## 2023-02-11 IMAGING — US US ABDOMEN COMPLETE
1 series · 14 of 25 positions shown · non-contrast
Comparison: None.

CLINICAL DATA: Right-sided abdominal pain

EXAM:
ABDOMEN ULTRASOUND COMPLETE

[Series 1: us abdomen complete · 0.19mm/px · 14 of 80 slices shown]
[im 1/80]
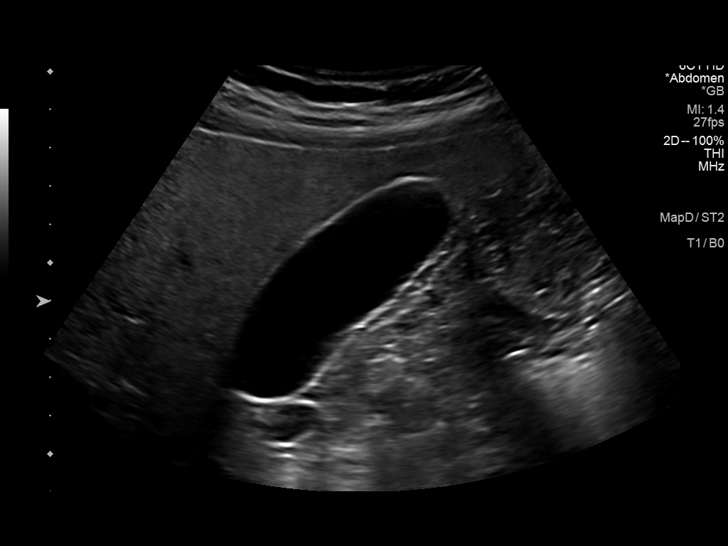
[im 7/80]
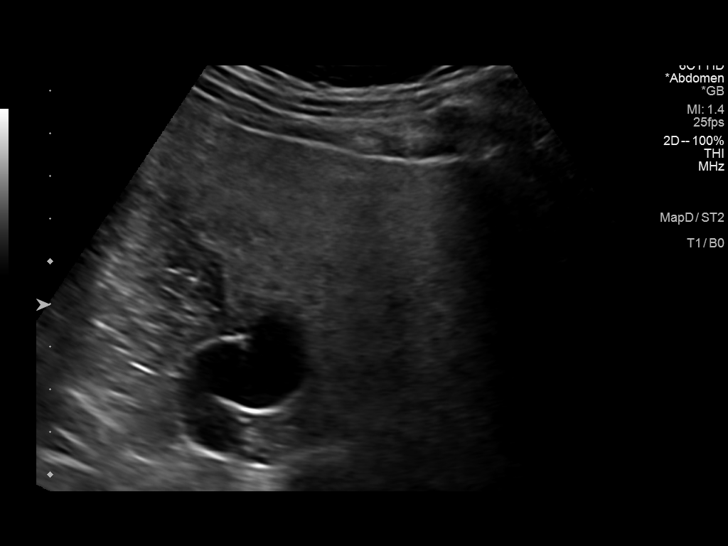
[im 14/80]
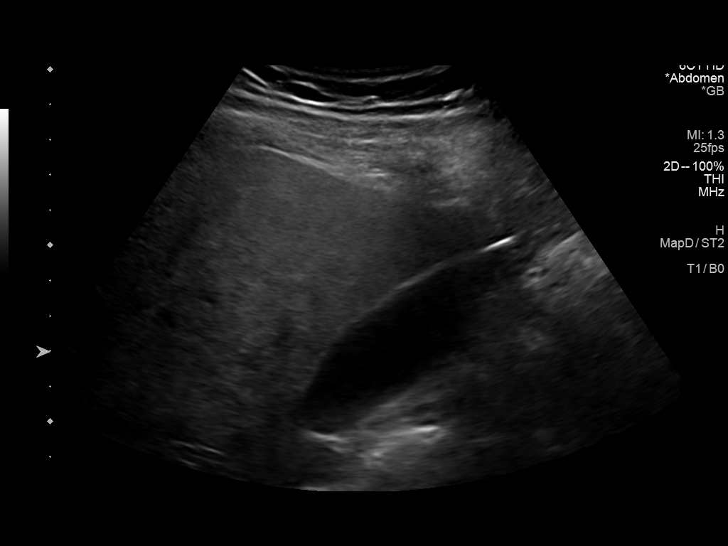
[im 20/80]
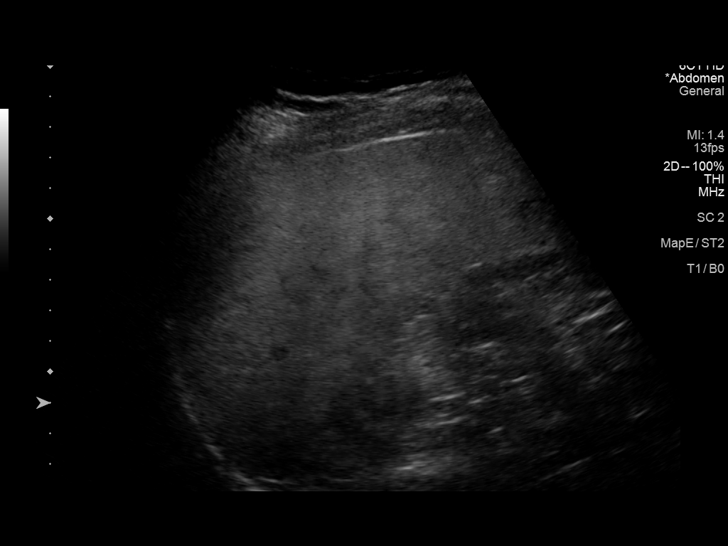
[im 27/80]
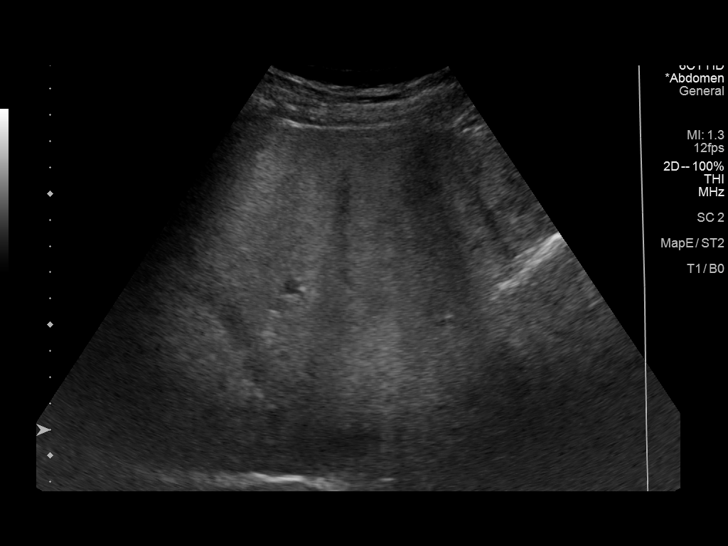
[im 30/80]
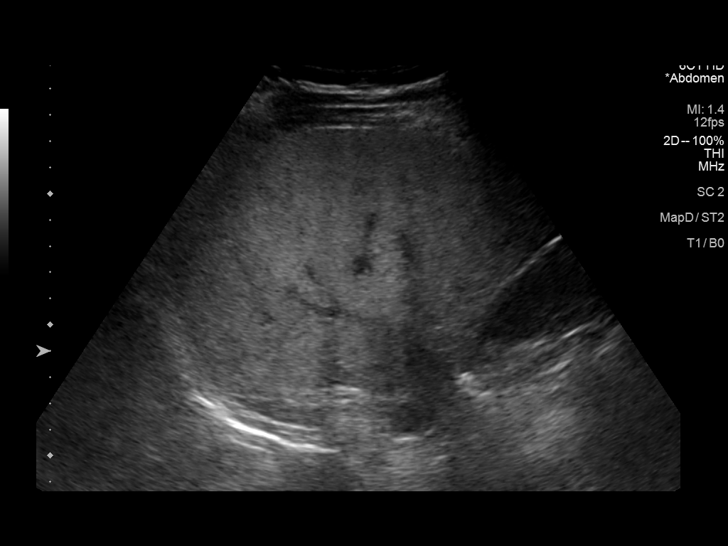
[im 37/80]
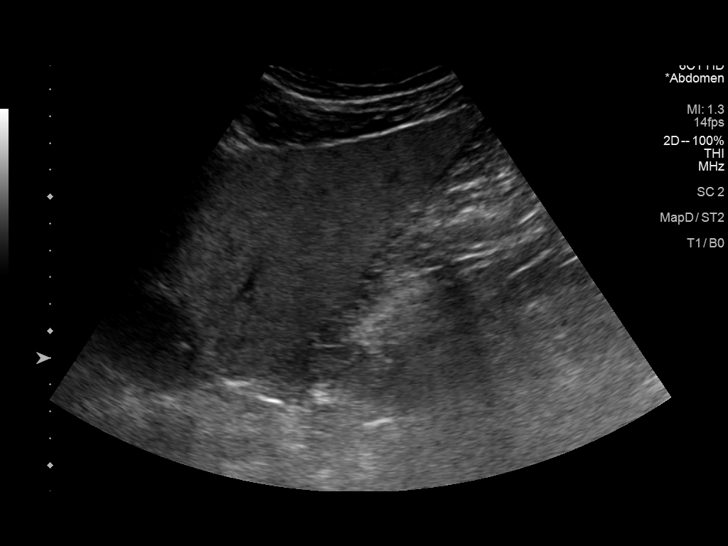
[im 43/80]
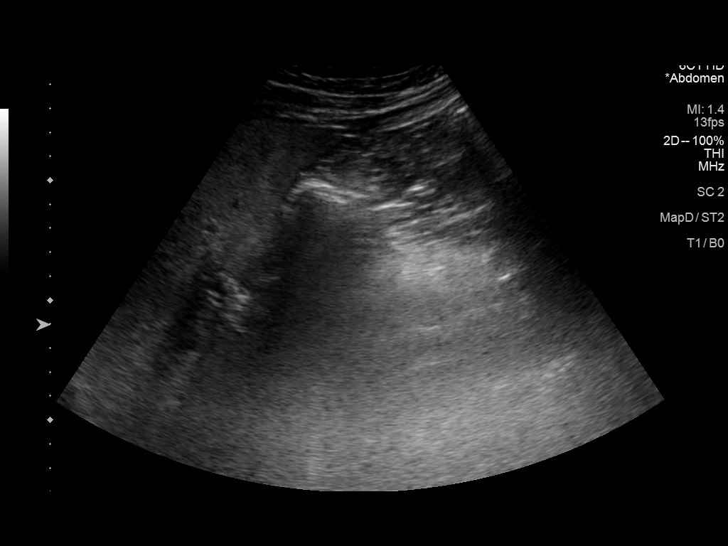
[im 50/80]
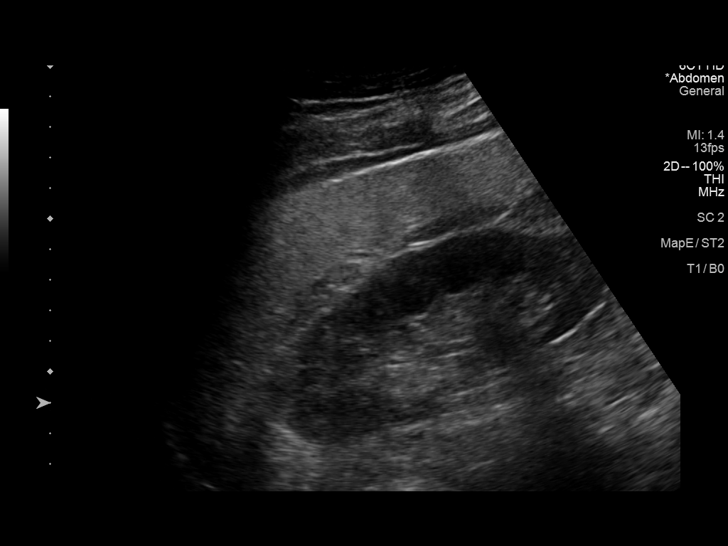
[im 53/80]
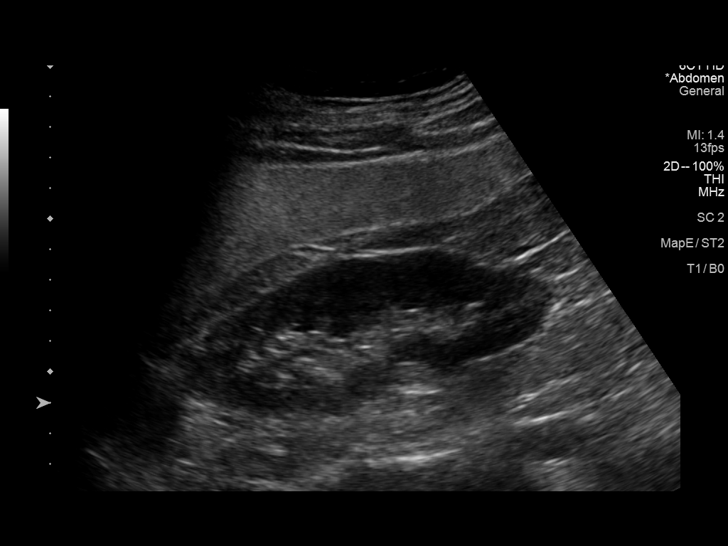
[im 60/80]
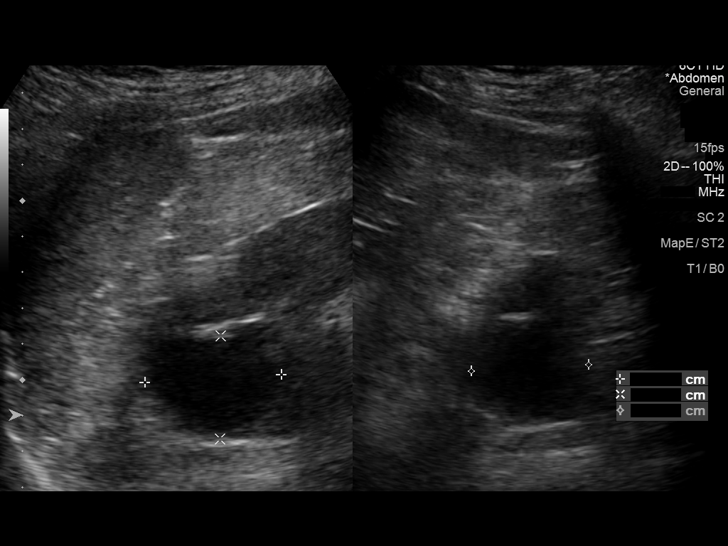
[im 66/80]
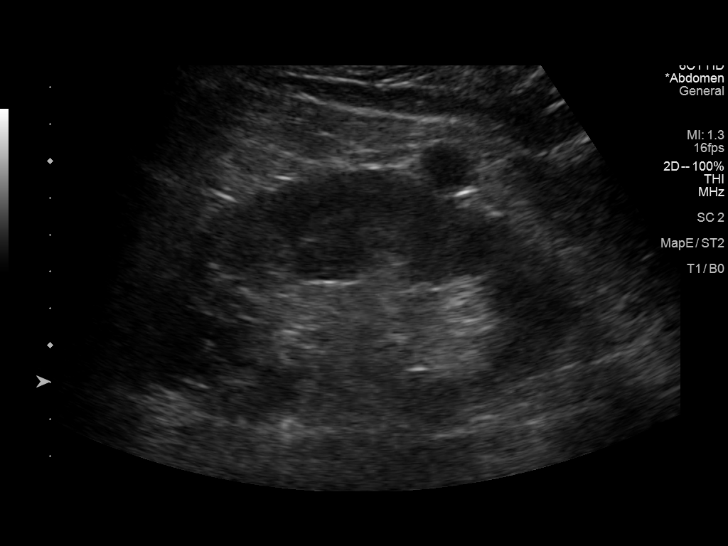
[im 73/80]
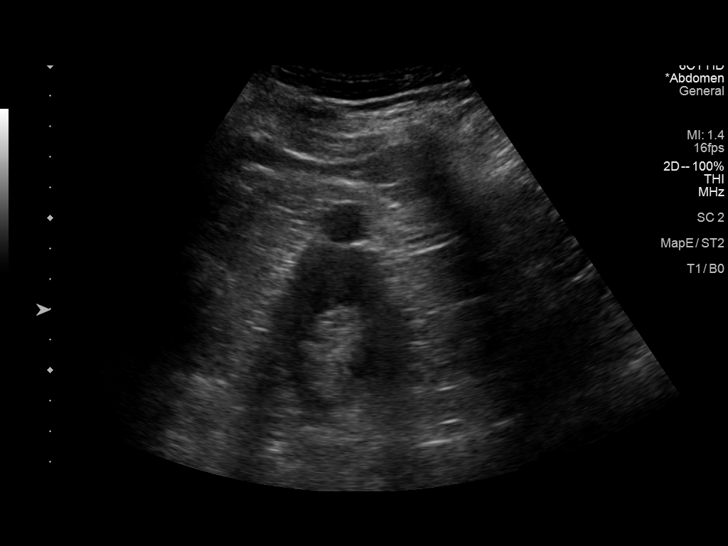
[im 80/80]
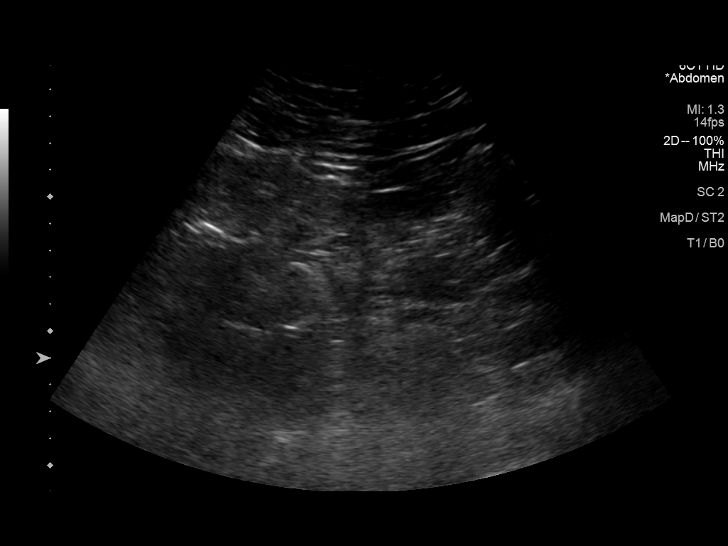

[14 of 25 positions shown; findings below may reference images not displayed]

FINDINGS: Gallbladder: Possible small polyp or stone measuring 5 mm. Normal
wall thickness. Negative sonographic Murphy.

Common bile duct: Diameter: 4.1 mm

Liver: Diffusely echogenic. No focal hepatic abnormality. Portal
vein is patent on color Doppler imaging with normal direction of
blood flow towards the liver.

IVC: No abnormality visualized.

Pancreas: Visualized portion unremarkable.

Spleen: Size and appearance within normal limits.

Right Kidney: Length: 12.5 cm. Echogenicity within normal limits. No
mass or hydronephrosis visualized.

Left Kidney: Length: 13 cm. Cortical echogenicity is normal. No
hydronephrosis. Cyst at the upper pole measuring 3 point 8 cm.
Exophytic cyst at the midpole measuring 1.6 cm.

Abdominal aorta: No aneurysm visualized.

Other findings: None.
IMPRESSION: 1. Small gallstone or polyp. Negative for acute gallbladder disease
by sonography
2. Echogenic liver consistent with steatosis
3. Left renal cysts

## 2023-05-07 DIAGNOSIS — J069 Acute upper respiratory infection, unspecified: Secondary | ICD-10-CM | POA: Diagnosis not present

## 2023-07-12 DIAGNOSIS — Z0000 Encounter for general adult medical examination without abnormal findings: Secondary | ICD-10-CM | POA: Diagnosis not present

## 2023-07-12 DIAGNOSIS — K219 Gastro-esophageal reflux disease without esophagitis: Secondary | ICD-10-CM | POA: Diagnosis not present

## 2023-07-12 DIAGNOSIS — E782 Mixed hyperlipidemia: Secondary | ICD-10-CM | POA: Diagnosis not present

## 2023-07-12 DIAGNOSIS — Z125 Encounter for screening for malignant neoplasm of prostate: Secondary | ICD-10-CM | POA: Diagnosis not present

## 2023-07-12 DIAGNOSIS — I10 Essential (primary) hypertension: Secondary | ICD-10-CM | POA: Diagnosis not present

## 2023-07-24 DIAGNOSIS — R110 Nausea: Secondary | ICD-10-CM | POA: Diagnosis not present

## 2023-07-24 DIAGNOSIS — G4700 Insomnia, unspecified: Secondary | ICD-10-CM | POA: Diagnosis not present

## 2023-07-24 DIAGNOSIS — F419 Anxiety disorder, unspecified: Secondary | ICD-10-CM | POA: Diagnosis not present

## 2023-08-07 DIAGNOSIS — J069 Acute upper respiratory infection, unspecified: Secondary | ICD-10-CM | POA: Diagnosis not present

## 2023-08-07 DIAGNOSIS — Z03818 Encounter for observation for suspected exposure to other biological agents ruled out: Secondary | ICD-10-CM | POA: Diagnosis not present

## 2023-08-07 DIAGNOSIS — R051 Acute cough: Secondary | ICD-10-CM | POA: Diagnosis not present

## 2023-08-23 DIAGNOSIS — G4709 Other insomnia: Secondary | ICD-10-CM | POA: Diagnosis not present

## 2023-08-23 DIAGNOSIS — R0602 Shortness of breath: Secondary | ICD-10-CM | POA: Diagnosis not present

## 2023-09-12 DIAGNOSIS — F5104 Psychophysiologic insomnia: Secondary | ICD-10-CM | POA: Diagnosis not present

## 2023-09-12 DIAGNOSIS — I10 Essential (primary) hypertension: Secondary | ICD-10-CM | POA: Diagnosis not present

## 2023-09-12 DIAGNOSIS — F411 Generalized anxiety disorder: Secondary | ICD-10-CM | POA: Diagnosis not present

## 2023-09-12 DIAGNOSIS — G4733 Obstructive sleep apnea (adult) (pediatric): Secondary | ICD-10-CM | POA: Diagnosis not present

## 2023-10-05 ENCOUNTER — Ambulatory Visit: Attending: Internal Medicine | Admitting: Internal Medicine

## 2023-10-05 ENCOUNTER — Encounter: Payer: Self-pay | Admitting: Internal Medicine

## 2023-10-05 ENCOUNTER — Other Ambulatory Visit: Payer: Self-pay | Admitting: *Deleted

## 2023-10-05 VITALS — BP 120/78 | HR 83 | Ht 70.5 in | Wt 237.2 lb

## 2023-10-05 DIAGNOSIS — E785 Hyperlipidemia, unspecified: Secondary | ICD-10-CM

## 2023-10-05 DIAGNOSIS — Z6833 Body mass index (BMI) 33.0-33.9, adult: Secondary | ICD-10-CM

## 2023-10-05 DIAGNOSIS — R0609 Other forms of dyspnea: Secondary | ICD-10-CM

## 2023-10-05 DIAGNOSIS — I10 Essential (primary) hypertension: Secondary | ICD-10-CM

## 2023-10-05 MED ORDER — METOPROLOL TARTRATE 100 MG PO TABS: 100.0000 mg | ORAL_TABLET | Freq: Once | ORAL | 0 refills | Status: AC

## 2023-10-05 NOTE — Patient Instructions (Signed)
 Medication Instructions:  No changes today *If you need a refill on your cardiac medications before your next appointment, please call your pharmacy*  Lab Work: Today: lipids, Lp(a), cmet, hgA1c  If you have labs (blood work) drawn today and your tests are completely normal, you will receive your results only by: MyChart Message (if you have MyChart) OR A paper copy in the mail If you have any lab test that is abnormal or we need to change your treatment, we will call you to review the results.  Testing/Procedures: Your physician has requested that you have an echocardiogram. Echocardiography is a painless test that uses sound waves to create images of your heart. It provides your doctor with information about the size and shape of your heart and how well your heart's chambers and valves are working. This procedure takes approximately one hour. There are no restrictions for this procedure. Please do NOT wear cologne, perfume, aftershave, or lotions (deodorant is allowed). Please arrive 15 minutes prior to your appointment time.  Please note: We ask at that you not bring children with you during ultrasound (echo/ vascular) testing. Due to room size and safety concerns, children are not allowed in the ultrasound rooms during exams. Our front office staff cannot provide observation of children in our lobby area while testing is being conducted. An adult accompanying a patient to their appointment will only be allowed in the ultrasound room at the discretion of the ultrasound technician under special circumstances. We apologize for any inconvenience.  Coronary CT Angiogram - see instructions below  Follow-Up: At [facility], you and your health needs are our priority.  As part of our continuing mission to provide you with exceptional heart care, our providers are all part of one team.  This team includes your primary Cardiologist (physician) and Advanced Practice Providers or APPs  (Physician Assistants and Nurse Practitioners) who all work together to provide you with the care you need, when you need it.  Your next appointment:   6 month(s)  Provider:   Arun K Thukkani, MD       Your cardiac CT will be scheduled at one of the below locations:   [facility] [address] [place], [place] [address] [phone]   OR   Jeralene Mom. [facility] and Vascular Tower [address]  [place], [place] [address] Opening October 01, 2023  If scheduled at [facility], please arrive at the [facility] and Children's Entrance (Entrance C2) of [facility] 30 minutes prior to test start time. You can use the FREE valet parking offered at entrance C (encouraged to control the heart rate for the test)  Proceed to the [facility] Radiology Department (first floor) to check-in and test prep.   All radiology patients and guests should use entrance C2 at [facility], accessed from [facility], even though the hospital's physical address listed is [address].    If scheduled at the Heart and Vascular Tower at [address], please enter the parking lot using the [address] entrance and use the FREE valet service at the patient drop-off area. Enter the buidling and check-in with registration on the main floor.  Please follow these instructions carefully (unless otherwise directed):  An IV will be required for this test and Nitroglycerin will be given.  Hold all erectile dysfunction medications at least 3 days (72 hrs) prior to test. (Ie viagra, cialis, sildenafil, tadalafil, etc)   On the Night Before  the Test: Be sure to Drink plenty of water. Do not consume any caffeinated/decaffeinated beverages or chocolate 12 hours prior to your test. Do not take any antihistamines 12 hours prior to your test.  On the Day of the Test: Drink plenty of water until 1 hour prior to the test. Do not eat any food 1 hour prior to  test. You may take your regular medications prior to the test.  Take metoprolol (Lopressor) two hours prior to test. Please HOLD hydrochlorothiazide on the morning of the test. Patients who wear a continuous glucose monitor MUST remove the device prior to scanning.   After the Test: Drink plenty of water. After receiving IV contrast, you may experience a mild flushed feeling. This is normal. On occasion, you may experience a mild rash up to 24 hours after the test. This is not dangerous. If this occurs, you can take Benadryl 25 mg, Zyrtec, Claritin, or Allegra and increase your fluid intake. (Patients taking Tikosyn should avoid Benadryl, and may take Zyrtec, Claritin, or Allegra) If you experience trouble breathing, this can be serious. If it is severe call 911 IMMEDIATELY. If it is mild, please call our office.  We will call to schedule your test 2-4 weeks out understanding that some insurance companies will need an authorization prior to the service being performed.   For more information and frequently asked questions, please visit our website : [email or web address]  For non-scheduling related questions, please contact the cardiac imaging nurse navigator should you have any questions/concerns: Cardiac Imaging Nurse Navigators Direct Office Dial: [phone]   For scheduling needs, including cancellations and rescheduling, please call [place], [phone].

## 2023-10-05 NOTE — Progress Notes (Signed)
 Thanks Cardiology Office Note:   Date:  10/05/2023  ID:  Gregory Adkins, DOB 28-May-1961, MRN [id number] PCP:  [name], MD (Inactive)  [facility] Providers Cardiologist:  [name], MD Referring MD: [name], DO  Chief Complaint/Reason for Referral: Dyspnea ASSESSMENT:    1. Dyspnea on exertion   2. Primary hypertension   3. Dyslipidemia   4. BMI 33.0-33.9,adult     PLAN:   In order of problems listed above: Dyspnea:  We will obtain a coronary CTA and echocardiogram to evaluate further.  If the patient has mild obstructive coronary artery disease, they will require a statin (with goal LDL < 70) and aspirin, if they have high-grade disease we will need to consider optimal medical therapy and if symptoms are refractory to medical therapy, then a cardiac catheterization with possible PCI will be pursued to alleviate symptoms.  If they have high risk disease we will proceed directly to cardiac catheterization.   Hypertension: Continue hydrochlorothiazide 25 mg daily, losartan 100 mg daily, amlodipine 5 mg daily.  BP is well-controlled today. Dyslipidemia: The patient does not have an indication for intensive lipid-lowering.  Continue atorvastatin 40 mg; check lipid panel, LFTs, LP(a) today.   Elevated BMI: Diet and exercise modification.  Check hemoglobin A1c for screening.            Dispo:  Return in about 6 months (around 04/06/2024).      Medication Adjustments/Labs and Tests Ordered: Current medicines are reviewed at length with the patient today.  Concerns regarding medicines are outlined above.  The following changes have been made:  no change   Labs/tests ordered: Orders Placed This Encounter  Procedures   CT CORONARY MORPH W/CTA COR W/SCORE W/CA W/CM &/OR WO/CM   Comprehensive metabolic panel with GFR   Lipid panel   Hemoglobin A1c   Lipoprotein A (LPA)   EKG 12-Lead   ECHOCARDIOGRAM COMPLETE    Medication Changes: Meds ordered this encounter   Medications   metoprolol tartrate (LOPRESSOR) 100 MG tablet    Sig: Take 1 tablet (100 mg total) by mouth once for 1 dose. Take 90-120 minutes prior to scan.    Dispense:  1 tablet    Refill:  0    Current medicines are reviewed at length with the patient today.  The patient does not have concerns regarding medicines.     History of Present Illness:      FOCUSED PROBLEM LIST:   Hypertension Hyperlipidemia Intolerant to rosuvastatin and pravastatin BMI 33 OSA on CPAP  May 2025:  Patient consents to use of AI scribe. The patient is a Gregory Adkins with develops medical problems here for recommendations regarding dyspnea.  The patient saw his primary care provider recently with complaints of dyspnea.  Given the patient's family history of coronary artery disease he was referred for cardiac evaluation.  Family the patient contracted the flu recently and has been short of breath since that time.  He has been experiencing shortness of breath, which worsened following a recent bout of the flu. The shortness of breath was present before the flu but became significantly more pronounced during the illness. It is primarily triggered by exertion, such as lifting heavy objects or climbing multiple flights of stairs, but not by walking up to the second floor of his house. No chest discomfort or shortness of breath while at rest. He denies any history of COVID-19 infection.  No episodes of lightheadedness or syncope, although he occasionally feels lightheaded when  standing up quickly, which he attributes to his blood pressure medications. He reports occasional ankle edema, which he associates with his use of amlodipine. No orthopnea and his CPAP therapy for sleep apnea is going well, with recent evaluations showing good results.  His family history is notable for a brother who passed away from a ruptured aorta and another brother with heart problems. Both brothers were significantly older than  him.  He mentions a recent weight fluctuation, having lost ten pounds during the flu and subsequently regaining it. His current weight is around 237 pounds, which has been stable for some time.         Current Medications: Current Meds  Medication Sig   amLODipine (NORVASC) 5 MG tablet Take 5 mg by mouth daily.   amoxicillin-clavulanate (AUGMENTIN) 875-125 MG tablet Take 1 tablet by mouth daily.   atorvastatin (LIPITOR) 40 MG tablet Take 40 mg by mouth daily.   hydrochlorothiazide (HYDRODIURIL) 25 MG tablet Take 25 mg by mouth every morning.   losartan (COZAAR) 100 MG tablet Take 100 mg by mouth daily.   metoprolol tartrate (LOPRESSOR) 100 MG tablet Take 1 tablet (100 mg total) by mouth once for 1 dose. Take 90-120 minutes prior to scan.   omeprazole (PRILOSEC) 20 MG capsule Take 20 mg by mouth every morning.   ramelteon (ROZEREM) 8 MG tablet Take 8 mg by mouth at bedtime as needed.   tamsulosin (FLOMAX) 0.4 MG CAPS capsule Take 0.8 mg by mouth daily.   venlafaxine XR (EFFEXOR-XR) 75 MG 24 hr capsule Take 75 mg by mouth daily.     Review of Systems:   Please see the history of present illness.    All other systems reviewed and are negative.     EKGs/Labs/Other Test Reviewed:   EKG:    EKG Interpretation Date/Time:  Friday Oct 05 2023 13:43:53 EDT Ventricular Rate:  83 PR Interval:  184 QRS Duration:  92 QT Interval:  378 QTC Calculation: 444 R Axis:   -38  Text Interpretation: Normal sinus rhythm Left axis deviation Minimal voltage criteria for LVH, may be normal variant ( R in aVL ) Possible Anterior infarct , age undetermined When compared with ECG of 27-Jan-2008 08:52, Borderline criteria for Anterior infarct are now Present Confirmed by [name] ([id number]) on 10/05/2023 1:46:42 PM         Risk Assessment/Calculations:          Physical Exam:   VS:  BP 120/78   Pulse 83   Ht 5' 10.5" (1.791 m)   Wt 237 lb 3.2 oz (107.6 kg)   SpO2 97%   BMI 33.55 kg/m         Wt Readings from Last 3 Encounters:  10/05/23 237 lb 3.2 oz (107.6 kg)      GENERAL:  No apparent distress, AOx3 HEENT:  No carotid bruits, +2 carotid impulses, no scleral icterus CAR: RRR no murmurs, gallops, rubs, or thrills RES:  Clear to auscultation bilaterally ABD:  Soft, nontender, nondistended, positive bowel sounds x 4 VASC:  +2 radial pulses, +2 carotid pulses NEURO:  CN 2-12 grossly intact; motor and sensory grossly intact PSYCH:  No active depression or anxiety EXT:  No edema, ecchymosis, or cyanosis  Signed, Meda Dudzinski K Hamish Banks, MD  10/05/2023 2:25 PM    [facility] [address] [place], [place], [place]  [address] Phone: [phone]; Fax: [phone]   Note:  This document was prepared using Dragon voice recognition software and  may include unintentional dictation errors.

## 2023-10-06 LAB — COMPREHENSIVE METABOLIC PANEL WITH GFR
ALT: 33 IU/L (ref 0–44)
AST: 22 IU/L (ref 0–40)
Albumin: 4.4 g/dL (ref 3.9–4.9)
Alkaline Phosphatase: 99 IU/L (ref 44–121)
BUN/Creatinine Ratio: 13 (ref 10–24)
BUN: 12 mg/dL (ref 8–27)
Bilirubin Total: 0.5 mg/dL (ref 0.0–1.2)
CO2: 23 mmol/L (ref 20–29)
Calcium: 9.6 mg/dL (ref 8.6–10.2)
Chloride: 100 mmol/L (ref 96–106)
Creatinine, Ser: 0.9 mg/dL (ref 0.76–1.27)
Globulin, Total: 2.6 g/dL (ref 1.5–4.5)
Glucose: 99 mg/dL (ref 70–99)
Potassium: 3.9 mmol/L (ref 3.5–5.2)
Sodium: 138 mmol/L (ref 134–144)
Total Protein: 7 g/dL (ref 6.0–8.5)
eGFR: 95 mL/min/{1.73_m2} (ref >59)

## 2023-10-06 LAB — LIPID PANEL
Chol/HDL Ratio: 4.3 ratio (ref 0.0–5.0)
Cholesterol, Total: 150 mg/dL (ref 100–199)
HDL: 35 mg/dL — ABNORMAL LOW (ref >39)
LDL Chol Calc (NIH): 89 mg/dL (ref 0–99)
Triglycerides: 148 mg/dL (ref 0–149)
VLDL Cholesterol Cal: 26 mg/dL (ref 5–40)

## 2023-10-06 LAB — HEMOGLOBIN A1C
Est. average glucose Bld gHb Est-mCnc: 134 mg/dL
Hgb A1c MFr Bld: 6.3 % — ABNORMAL HIGH (ref 4.8–5.6)

## 2023-10-06 LAB — LIPOPROTEIN A (LPA): Lipoprotein (a): <8.4 nmol/L (ref <75.0)

## 2023-10-07 ENCOUNTER — Encounter: Payer: Self-pay | Admitting: Internal Medicine

## 2023-10-17 DIAGNOSIS — F5104 Psychophysiologic insomnia: Secondary | ICD-10-CM | POA: Diagnosis not present

## 2023-10-17 DIAGNOSIS — G4733 Obstructive sleep apnea (adult) (pediatric): Secondary | ICD-10-CM | POA: Diagnosis not present

## 2023-10-17 DIAGNOSIS — F411 Generalized anxiety disorder: Secondary | ICD-10-CM | POA: Diagnosis not present

## 2023-10-19 ENCOUNTER — Encounter (HOSPITAL_COMMUNITY): Payer: Self-pay

## 2023-10-22 ENCOUNTER — Telehealth (HOSPITAL_COMMUNITY): Payer: Self-pay | Admitting: *Deleted

## 2023-10-22 NOTE — Telephone Encounter (Signed)
 Attempted to call patient regarding upcoming cardiac CT appointment. Left message on voicemail with name and callback number  Larey Brick RN Navigator Cardiac Imaging [facility] [phone] Office [phone] Cell

## 2023-10-23 ENCOUNTER — Ambulatory Visit (HOSPITAL_COMMUNITY)
Admission: RE | Admit: 2023-10-23 | Discharge: 2023-10-23 | Disposition: A | Discharge status: Home / Self Care | Source: Ambulatory Visit | Attending: Internal Medicine | Admitting: Internal Medicine

## 2023-10-23 DIAGNOSIS — I2510 Atherosclerotic heart disease of native coronary artery without angina pectoris: Secondary | ICD-10-CM

## 2023-10-23 DIAGNOSIS — R0609 Other forms of dyspnea: Secondary | ICD-10-CM | POA: Diagnosis not present

## 2023-10-23 MED ORDER — NITROGLYCERIN 0.4 MG SL SUBL
0.8000 mg | SUBLINGUAL_TABLET | Freq: Once | SUBLINGUAL | Status: AC
  Administered 2023-10-23: 0.8 mg via SUBLINGUAL

## 2023-10-23 MED ORDER — NITROGLYCERIN 0.4 MG SL SUBL
SUBLINGUAL_TABLET | SUBLINGUAL | Status: AC
  Filled 2023-10-23: qty 2

## 2023-10-23 MED ORDER — IOHEXOL 350 MG/ML SOLN
100.0000 mL | Freq: Once | INTRAVENOUS | Status: AC | PRN
  Administered 2023-10-23: 100 mL via INTRAVENOUS

## 2023-10-24 ENCOUNTER — Ambulatory Visit: Payer: Self-pay | Admitting: Internal Medicine

## 2023-10-24 DIAGNOSIS — E785 Hyperlipidemia, unspecified: Secondary | ICD-10-CM

## 2023-10-24 DIAGNOSIS — Z79899 Other long term (current) drug therapy: Secondary | ICD-10-CM

## 2023-11-02 MED ORDER — ASPIRIN 81 MG PO TBEC: 81.0000 mg | DELAYED_RELEASE_TABLET | Freq: Every day | ORAL | Status: AC

## 2023-11-02 NOTE — Telephone Encounter (Signed)
-----   Message from Arun K Thukkani sent at 10/24/2023 12:46 AM EDT ----- Start ASA 81.  Needs lipids/lfts in 2 mo, goal LDL < 70

## 2023-11-02 NOTE — Telephone Encounter (Signed)
 Called and spoke with patient who verbalized understanding. Repeat lab orders placed and released at this time.

## 2023-11-13 ENCOUNTER — Ambulatory Visit (HOSPITAL_COMMUNITY)

## 2023-11-19 ENCOUNTER — Ambulatory Visit: Admitting: Cardiology

## 2023-12-05 DIAGNOSIS — L608 Other nail disorders: Secondary | ICD-10-CM | POA: Diagnosis not present

## 2023-12-25 ENCOUNTER — Ambulatory Visit (HOSPITAL_COMMUNITY)
Admission: RE | Admit: 2023-12-25 | Discharge: 2023-12-25 | Disposition: A | Discharge status: Home / Self Care | Source: Ambulatory Visit | Attending: Cardiology | Admitting: Cardiology

## 2023-12-25 DIAGNOSIS — R0609 Other forms of dyspnea: Secondary | ICD-10-CM | POA: Insufficient documentation

## 2023-12-25 LAB — ECHOCARDIOGRAM COMPLETE
Area-P 1/2: 3.85 cm2
S' Lateral: 3.2 cm

## 2024-01-08 DIAGNOSIS — E782 Mixed hyperlipidemia: Secondary | ICD-10-CM | POA: Diagnosis not present

## 2024-01-08 DIAGNOSIS — I10 Essential (primary) hypertension: Secondary | ICD-10-CM | POA: Diagnosis not present

## 2024-01-08 DIAGNOSIS — K219 Gastro-esophageal reflux disease without esophagitis: Secondary | ICD-10-CM | POA: Diagnosis not present

## 2024-01-08 DIAGNOSIS — F411 Generalized anxiety disorder: Secondary | ICD-10-CM | POA: Diagnosis not present

## 2024-04-23 NOTE — Progress Notes (Unsigned)
 Thanks Cardiology Office Note:   Date:  04/24/2024  ID:  Gregory Adkins, DOB 07-Mar-1961, MRN [id number] PCP:  Patient, No Pcp Per  [facility] Providers Cardiologist:  Wendel Haws, MD Referring MD: No ref. provider found  Chief Complaint/Reason for Referral: Follow-up dyspnea ASSESSMENT:    1. Diastolic dysfunction   2. Mild CAD   3. Primary hypertension   4. Hyperlipidemia LDL goal <70   5. Prediabetes   6. BMI 33.0-33.9,adult      PLAN:   In order of problems listed above: Diastolic dysfunction: Continue losartan 100 mg, start Jardiance 10mg  Mild CAD: Restart aspirin  81 mg, continue atorvastatin with dose adjustment as below Hypertension: Continue hydrochlorothiazide 25 mg daily, losartan 100 mg daily, amlodipine 5 mg daily.  BP well controlled today. Hyperlipidemia: LDL in May was 89, goal LDL is less than 70.  Increase atorvastatin to 80 mg and check lipid, LFTs in 2 months Prediabetes: Continue to aggressively manage cardiovascular risk factors. Elevated BMI: Diet and exercise modification.  I recommended moderate exercise 3 to 4 days a week to begin with if he develops a formal diagnosis of diabetes will consider GLP-1 receptor agonist therapy.       I spent 35 minutes reviewing all clinical data during and prior to this visit including all relevant imaging studies, laboratories, clinical information from other health systems and prior notes from both Cardiology and other specialties, interviewing the patient, conducting a complete physical examination, and coordinating care in order to formulate a comprehensive and personalized evaluation and treatment plan.      Dispo:  Return in about 1 year (around 04/24/2025).      Medication Adjustments/Labs and Tests Ordered: Current medicines are reviewed at length with the patient today.  Concerns regarding medicines are outlined above.  The following changes have been made:  no change   Labs/tests ordered: Orders Placed  This Encounter  Procedures   Lipid panel   Hepatic function panel    Medication Changes: Meds ordered this encounter  Medications   atorvastatin (LIPITOR) 80 MG tablet    Sig: Take 1 tablet (80 mg total) by mouth daily.    Dispense:  30 tablet    Refill:  3    Increasing to 80 mg   empagliflozin (JARDIANCE) 10 MG TABS tablet    Sig: Take 1 tablet (10 mg total) by mouth daily before breakfast.    Dispense:  30 tablet    Refill:  3    Current medicines are reviewed at length with the patient today.  The patient does not have concerns regarding medicines.     History of Present Illness:      FOCUSED PROBLEM LIST:   CAD  Mild prox/mid LAD disease coronary CTA 2025  Diastolic dysfunction G1 DD, no significant valve issues, EF 55 to 60% TTE July 2025 Hypertension  Hyperlipidemia LP(a) less than 8.4 Intolerant of rosuvastatin and pravastatin Prediabetes Hemoglobin A1c 6.06 Oct 2023 OSA  on CPAP BMI 33  May 2025:  Patient consents to use of AI scribe. The patient is a 63 year old male with develops medical problems here for recommendations regarding dyspnea.  The patient saw his primary care provider recently with complaints of dyspnea.  Given the patient's family history of coronary artery disease he was referred for cardiac evaluation.  Family the patient contracted the flu recently and has been short of breath since that time.  He has been experiencing shortness of breath, which worsened following a recent bout of  the flu. The shortness of breath was present before the flu but became significantly more pronounced during the illness. It is primarily triggered by exertion, such as lifting heavy objects or climbing multiple flights of stairs, but not by walking up to the second floor of his house. No chest discomfort or shortness of breath while at rest. He denies any history of COVID-19 infection.  No episodes of lightheadedness or syncope, although he occasionally feels  lightheaded when standing up quickly, which he attributes to his blood pressure medications. He reports occasional ankle edema, which he associates with his use of amlodipine. No orthopnea and his CPAP therapy for sleep apnea is going well, with recent evaluations showing good results.  His family history is notable for a brother who passed away from a ruptured aorta and another brother with heart problems. Both brothers were significantly older than him.  He mentions a recent weight fluctuation, having lost ten pounds during the flu and subsequently regaining it. His current weight is around 237 pounds, which has been stable for some time.  Plan: Obtain echocardiogram, coronary CTA, check lipid panel LFTs, LP(a), and hemoglobin A1c.  November 2025:  Patient consents to use of AI scribe. The patient's echocardiogram and coronary CTA were reassuring.  His hemoglobin A1c was suggestive of prediabetes.  He was started on aspirin  81 mg and atorvastatin 40 mg.  He is currently on losartan for hypertension and atorvastatin for cholesterol management. He has been taking atorvastatin for a long time and previously had issues with rosuvastatin and pravastatin. He forgot to take aspirin .  His breathing is stable. He acknowledges a need to lose weight due to recent gain, which could exacerbate his prediabetes diagnosed during a previous visit.  His LDL cholesterol was last checked in May and was 89 mg/dL.  He is preparing for retirement and plans to stay active by obtaining a license to work in his [employer]. He has a long history of working in airline pilot, specifically in engineer, technical sales.  No issues with breathing. He denies needing any medication refills at this time and states that his family doctor and nurse help manage his medications.        Current Medications: Current Meds  Medication Sig   amLODipine (NORVASC) 5 MG tablet Take 5 mg by mouth daily.   aspirin  EC 81 MG tablet  Take 1 tablet (81 mg total) by mouth daily. Swallow whole.   atorvastatin (LIPITOR) 80 MG tablet Take 1 tablet (80 mg total) by mouth daily.   empagliflozin (JARDIANCE) 10 MG TABS tablet Take 1 tablet (10 mg total) by mouth daily before breakfast.   hydrochlorothiazide (HYDRODIURIL) 25 MG tablet Take 25 mg by mouth every morning.   losartan (COZAAR) 100 MG tablet Take 100 mg by mouth daily.   omeprazole (PRILOSEC) 20 MG capsule Take 20 mg by mouth every morning.   tamsulosin (FLOMAX) 0.4 MG CAPS capsule Take 0.8 mg by mouth daily.   venlafaxine XR (EFFEXOR-XR) 75 MG 24 hr capsule Take 75 mg by mouth daily.   [DISCONTINUED] atorvastatin (LIPITOR) 40 MG tablet Take 40 mg by mouth daily.     Review of Systems:   Please see the history of present illness.    All other systems reviewed and are negative.     EKGs/Labs/Other Test Reviewed:   EKG: 2025 normal sinus rhythm minimal LVH  EKG Interpretation Date/Time:    Ventricular Rate:    PR Interval:    QRS Duration:  QT Interval:    QTC Calculation:   R Axis:      Text Interpretation:           Risk Assessment/Calculations:          Physical Exam:   VS:  BP 110/70 (BP Location: Left Arm, Patient Position: Sitting, Cuff Size: Large)   Pulse 88   Ht 5' 10.5 (1.791 m)   Wt 243 lb 6.4 oz (110.4 kg)   SpO2 96%   BMI 34.43 kg/m        Wt Readings from Last 3 Encounters:  04/24/24 243 lb 6.4 oz (110.4 kg)  10/05/23 237 lb 3.2 oz (107.6 kg)      GENERAL:  No apparent distress, AOx3 HEENT:  No carotid bruits, +2 carotid impulses, no scleral icterus CAR: RRR no murmurs, gallops, rubs, or thrills RES:  Clear to auscultation bilaterally ABD:  Soft, nontender, nondistended, positive bowel sounds x 4 VASC:  +2 radial pulses, +2 carotid pulses NEURO:  CN 2-12 grossly intact; motor and sensory grossly intact PSYCH:  No active depression or anxiety EXT:  No edema, ecchymosis, or cyanosis  Signed, Buckley Bradly K Layla Kesling, MD   04/24/2024 1:37 PM    [facility] [address] [place], [place], [place]  [address] Phone: [phone]; Fax: [phone]   Note:  This document was prepared using Dragon voice recognition software and may include unintentional dictation errors.

## 2024-04-24 ENCOUNTER — Ambulatory Visit: Attending: Internal Medicine | Admitting: Internal Medicine

## 2024-04-24 ENCOUNTER — Encounter: Payer: Self-pay | Admitting: Internal Medicine

## 2024-04-24 VITALS — BP 110/70 | HR 88 | Ht 70.5 in | Wt 243.4 lb

## 2024-04-24 DIAGNOSIS — I10 Essential (primary) hypertension: Secondary | ICD-10-CM | POA: Diagnosis not present

## 2024-04-24 DIAGNOSIS — Z6833 Body mass index (BMI) 33.0-33.9, adult: Secondary | ICD-10-CM

## 2024-04-24 DIAGNOSIS — I2510 Atherosclerotic heart disease of native coronary artery without angina pectoris: Secondary | ICD-10-CM

## 2024-04-24 DIAGNOSIS — I5189 Other ill-defined heart diseases: Secondary | ICD-10-CM

## 2024-04-24 DIAGNOSIS — E785 Hyperlipidemia, unspecified: Secondary | ICD-10-CM | POA: Diagnosis not present

## 2024-04-24 DIAGNOSIS — R7303 Prediabetes: Secondary | ICD-10-CM

## 2024-04-24 MED ORDER — ATORVASTATIN CALCIUM 80 MG PO TABS: 80.0000 mg | ORAL_TABLET | Freq: Every day | ORAL | 3 refills | Status: AC

## 2024-04-24 MED ORDER — EMPAGLIFLOZIN 10 MG PO TABS: 10.0000 mg | ORAL_TABLET | Freq: Every day | ORAL | 3 refills | Status: AC

## 2024-04-24 NOTE — Patient Instructions (Addendum)
 Medication Instructions:  START Jardiance  10 mg once daily  INCREASE Atorvastatin  (Lipitor) 80 mg once daily  **Make sure to begin taking Aspirin  81 mg once daily**  *If you need a refill on your cardiac medications before your next appointment, please call your pharmacy*  Lab Work: To be completed in 2 months (approximately 06/25/23): lipid panel and LFT  If you have labs (blood work) drawn today and your tests are completely normal, you will receive your results only by: MyChart Message (if you have MyChart) OR A paper copy in the mail If you have any lab test that is abnormal or we need to change your treatment, we will call you to review the results.  Testing/Procedures: None ordered today.  Follow-Up: At [facility], you and your health needs are our priority.  As part of our continuing mission to provide you with exceptional heart care, our providers are all part of one team.  This team includes your primary Cardiologist (physician) and Advanced Practice Providers or APPs (Physician Assistants and Nurse Practitioners) who all work together to provide you with the care you need, when you need it.  Your next appointment:   1 year(s)  Provider:   Glendia Ferrier, PA-C
